# Patient Record
Sex: Female | Born: 1985 | Race: White | Hispanic: No | Marital: Single | Smoking: Never smoker
Health system: Southern US, Community
[De-identification: ages and names within clinical notes are randomized; demographics above are authoritative.]

## PROBLEM LIST (undated history)

## (undated) DIAGNOSIS — J45909 Unspecified asthma, uncomplicated: Secondary | ICD-10-CM

## (undated) DIAGNOSIS — F329 Major depressive disorder, single episode, unspecified: Secondary | ICD-10-CM

## (undated) DIAGNOSIS — D649 Anemia, unspecified: Secondary | ICD-10-CM

## (undated) DIAGNOSIS — F32A Depression, unspecified: Secondary | ICD-10-CM

## (undated) HISTORY — DX: Unspecified asthma, uncomplicated: J45.909

## (undated) HISTORY — DX: Depression, unspecified: F32.A

## (undated) HISTORY — DX: Major depressive disorder, single episode, unspecified: F32.9

## (undated) HISTORY — DX: Anemia, unspecified: D64.9

---
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
## 2007-05-21 IMAGING — US US RENAL KIDNEY
1 series · 17 of 25 positions shown · non-contrast
Comparison: none

REASON FOR EXAM: Pyelonephritis
COMMENTS:

[Series 1: us renal kidney · 17 of 26 slices shown]
[im 1/26]
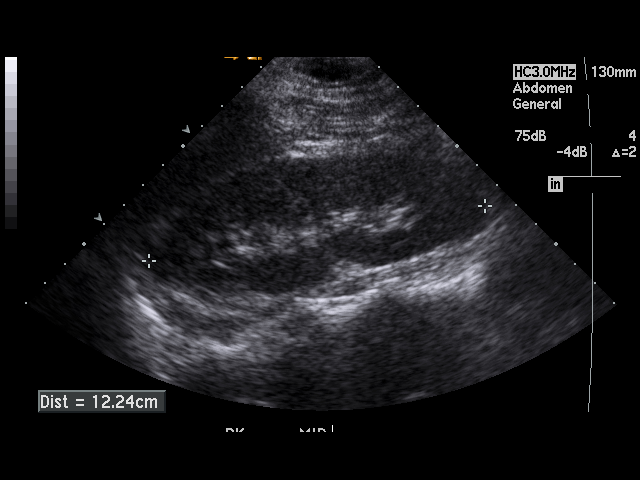
[im 3/26]
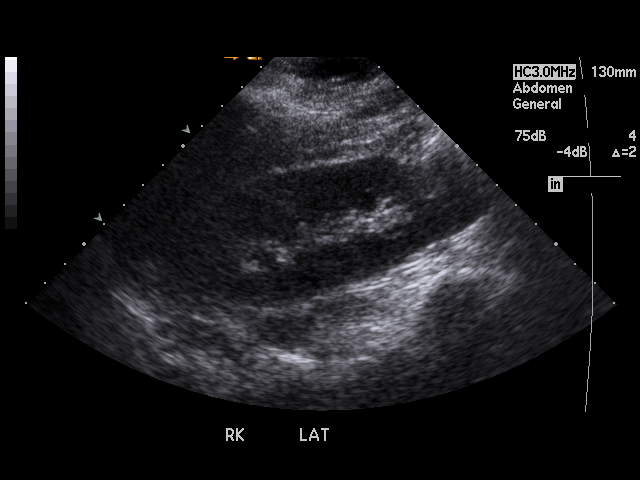
[im 4/26]
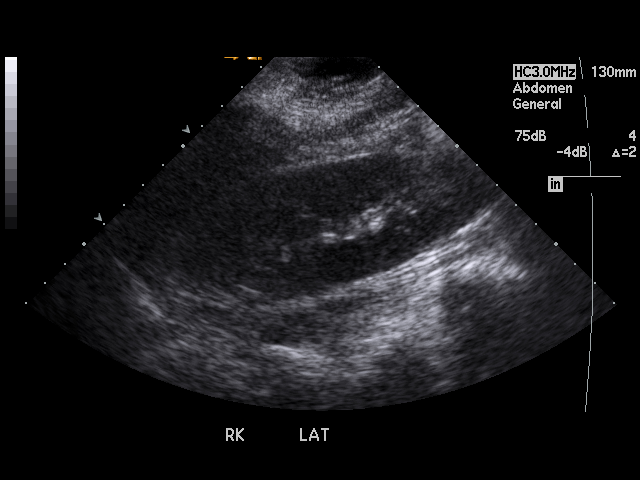
[im 6/26]
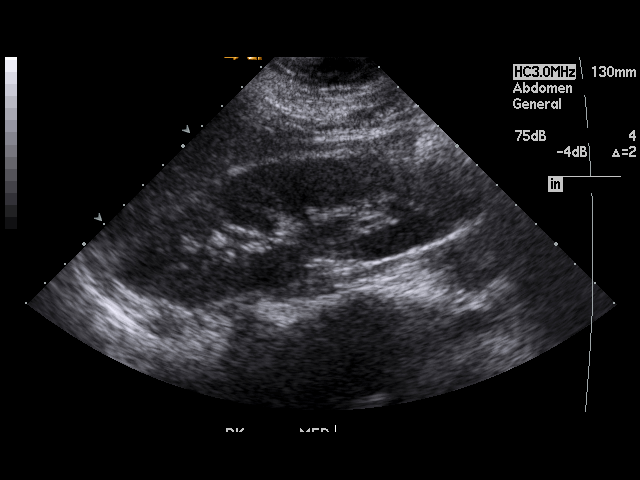
[im 7/26]
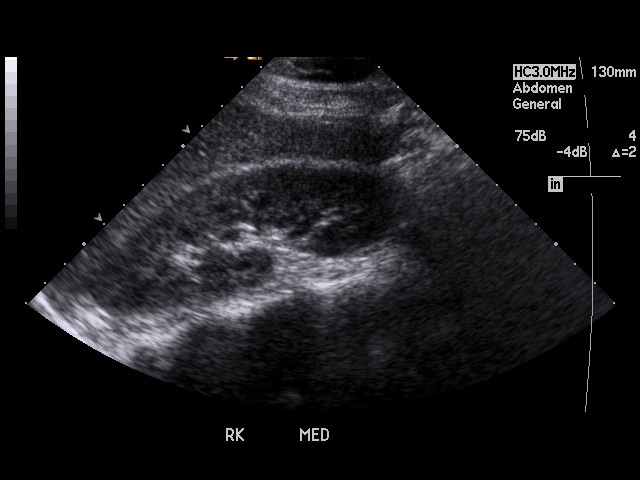
[im 9/26]
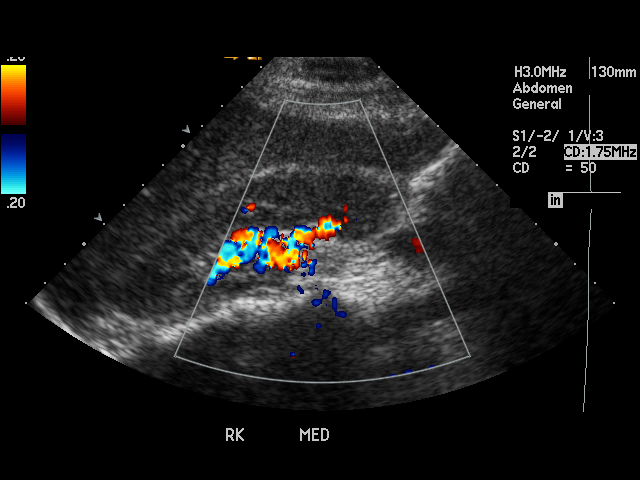
[im 10/26]
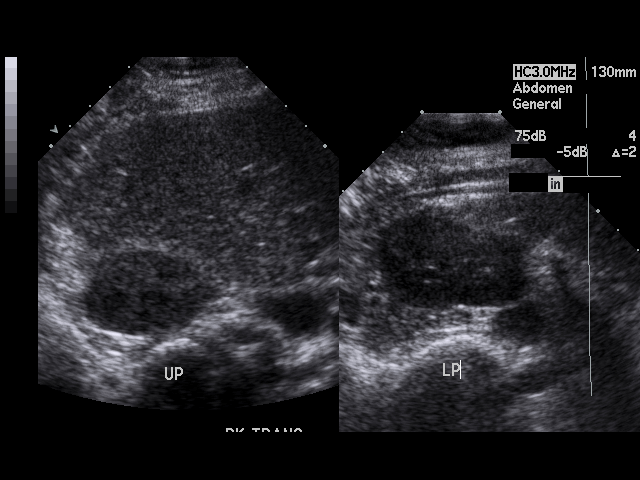
[im 12/26]
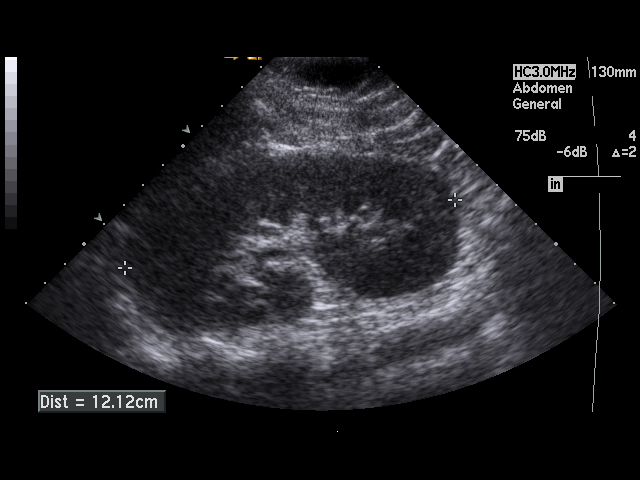
[im 13/26]
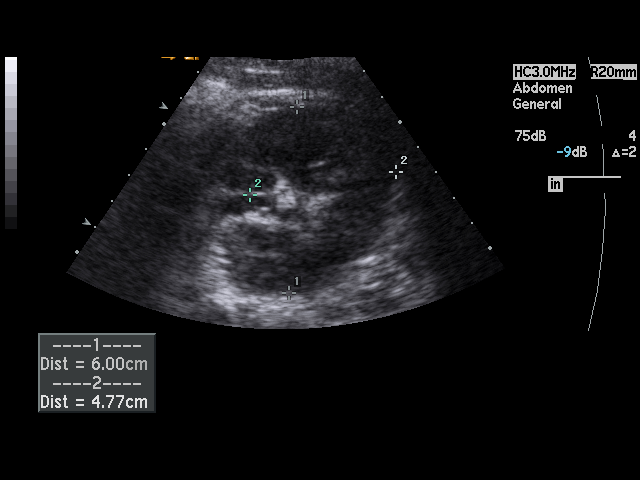
[im 14/26]
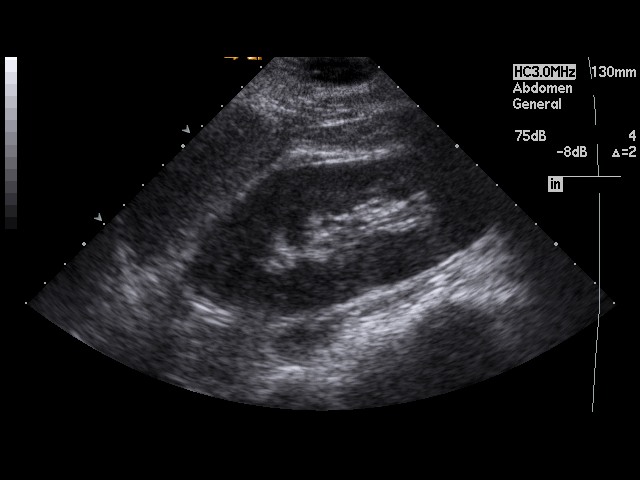
[im 16/26]
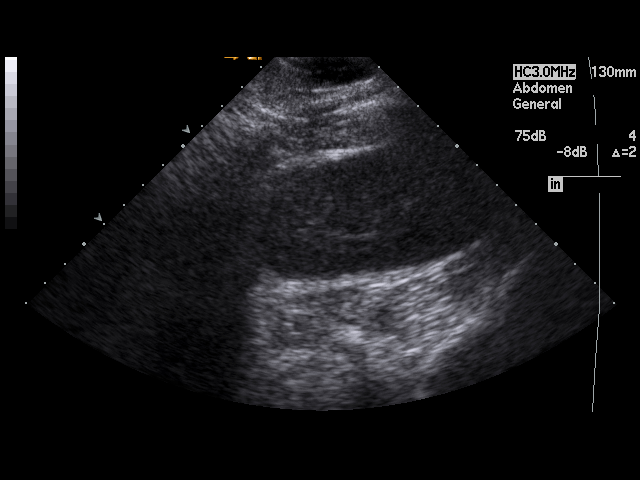
[im 17/26]
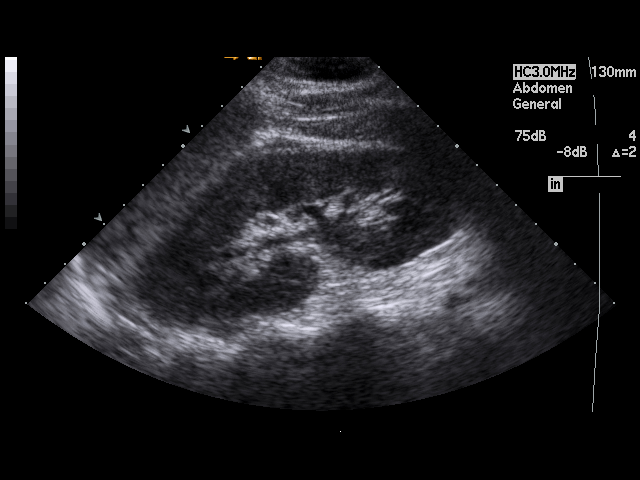
[im 19/26]
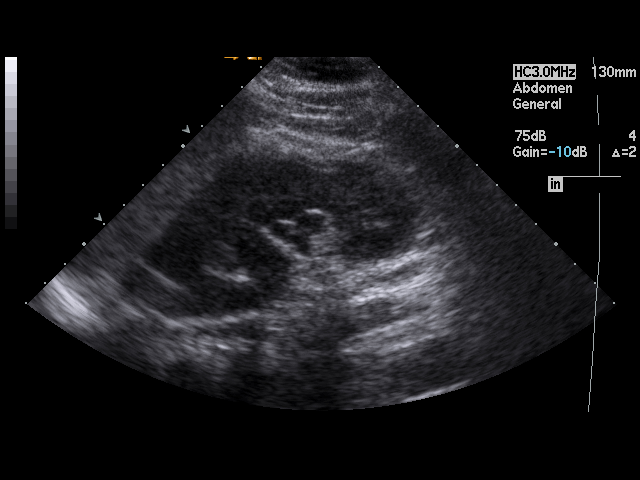
[im 20/26]
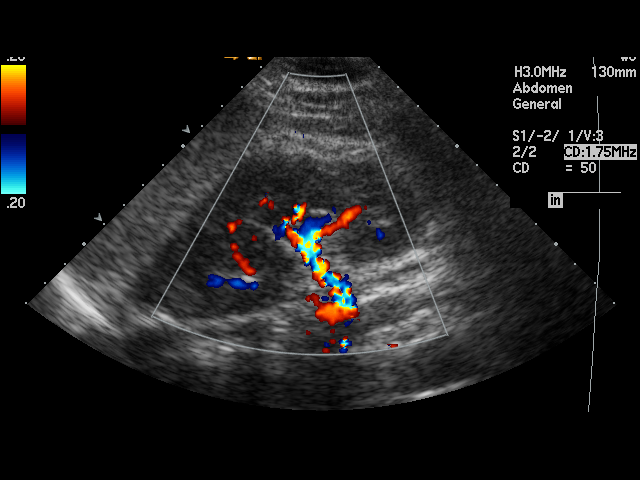
[im 22/26]
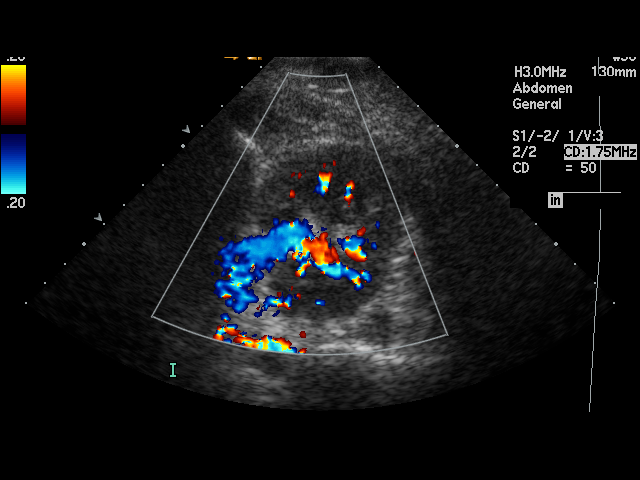
[im 23/26]
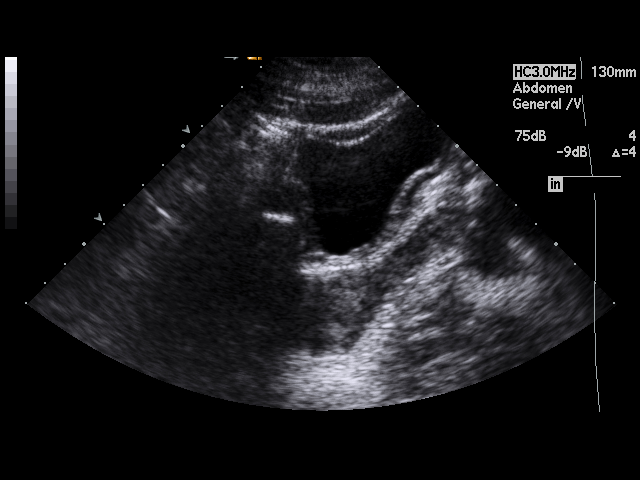
[im 26/26]
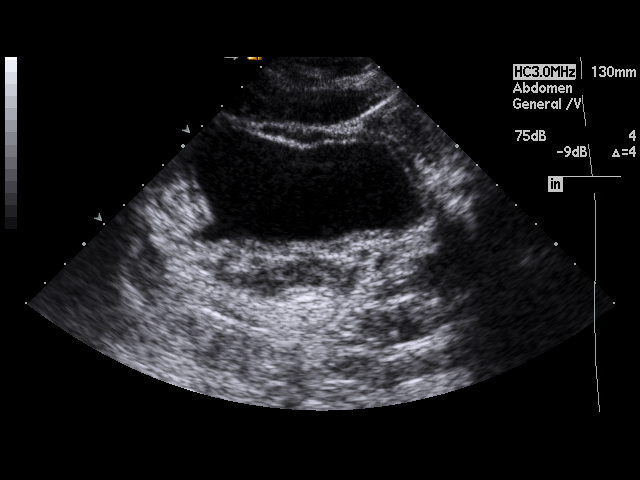

[17 of 25 positions shown; findings below may reference images not displayed]

PROCEDURE:     US  - US KIDNEY BILATERAL  - July 05, 2005  [DATE]

RESULT:     The RIGHT kidney measures 12.24 cm x 4.92 cm x 5.10 cm. The LEFT
kidney measures 12.12 cm x 6.00 cm x 4.77 cm. No solid or cystic renal mass
lesions are seen. Renal cortical margins are smooth. Renal cortex is normal
in thickness bilaterally. There is no hydronephrosis. No renal
calcifications are identified.
IMPRESSION: No significant abnormalities are noted.

## 2009-09-01 ENCOUNTER — Other Ambulatory Visit: Admission: RE | Admit: 2009-09-01 | Discharge: 2009-09-01 | Payer: Self-pay | Admitting: Obstetrics and Gynecology

## 2010-03-08 ENCOUNTER — Inpatient Hospital Stay: Payer: Self-pay | Admitting: Specialist

## 2012-01-24 IMAGING — US US PELV - US TRANSVAGINAL
1 series · 17 of 25 positions shown · non-contrast
Comparison: none

REASON FOR EXAM: Lower abdominal pain. Fever. Hx of IUD
COMMENTS:

[Series 1: us pelv - us transvaginal · 17 of 43 slices shown]
[im 1/43]
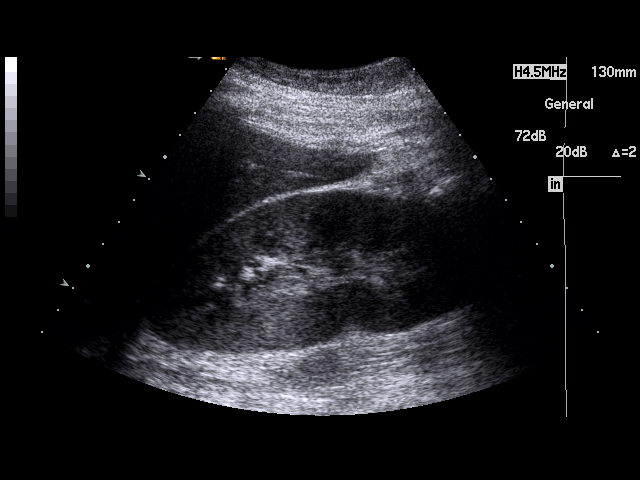
[im 4/43]
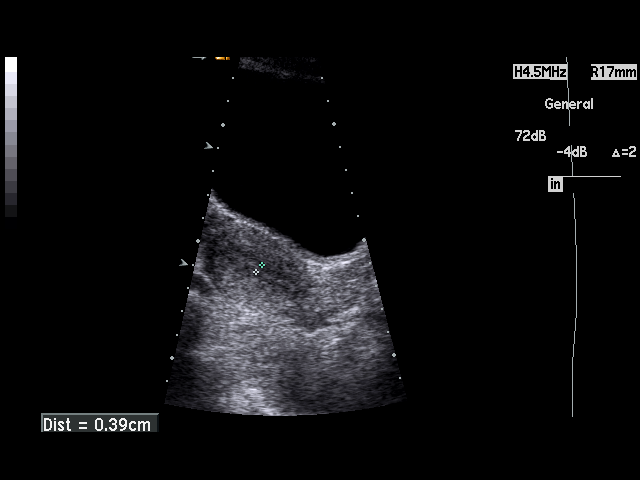
[im 6/43]
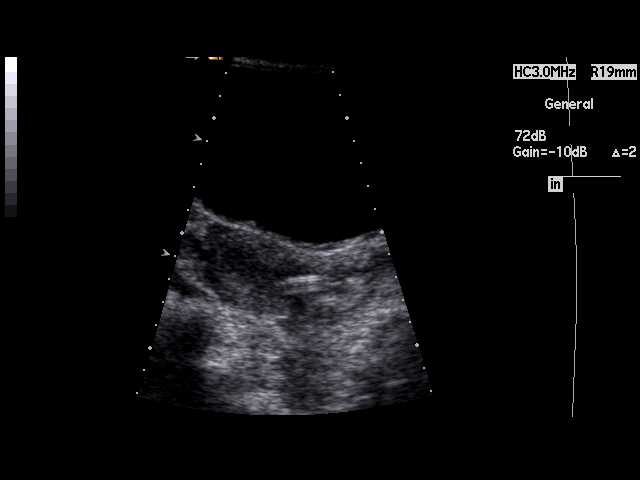
[im 9/43]
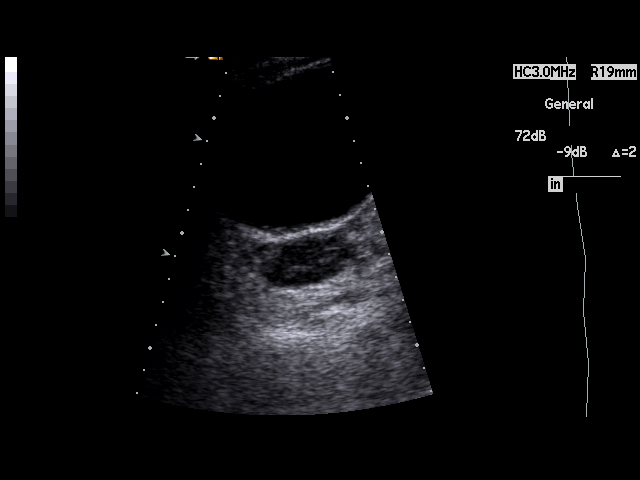
[im 11/43]
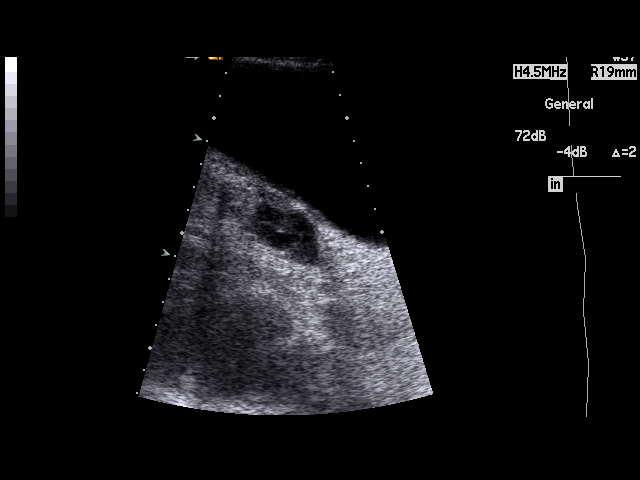
[im 15/43]
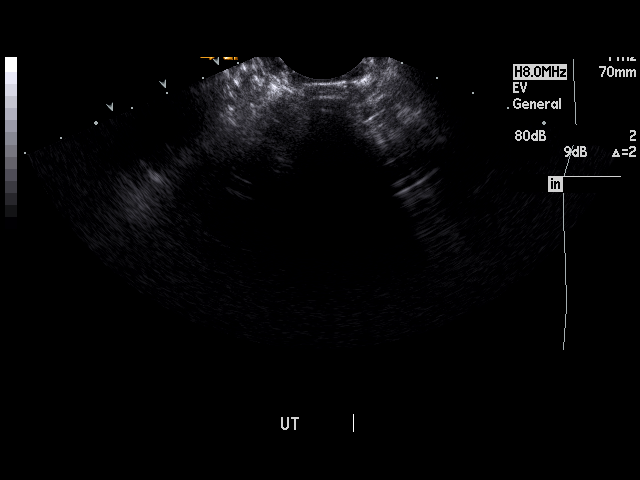
[im 16/43]
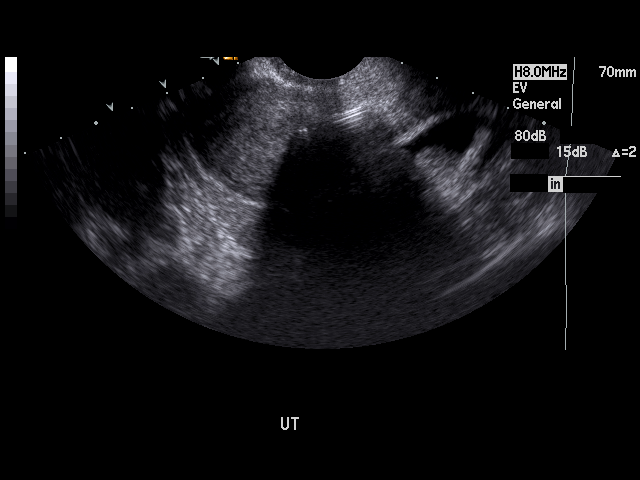
[im 20/43]
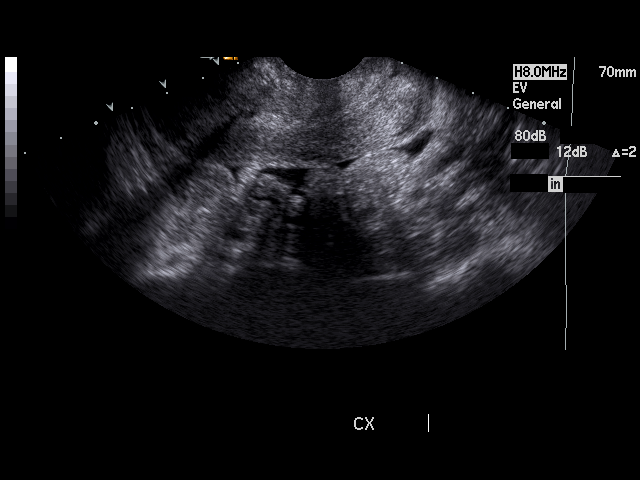
[im 22/43]
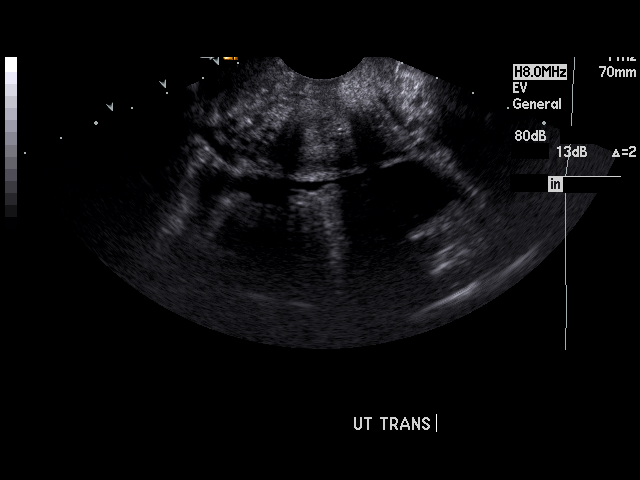
[im 23/43]
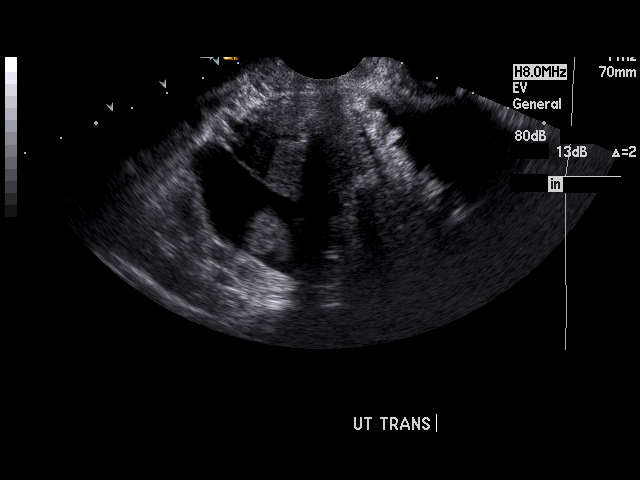
[im 27/43]
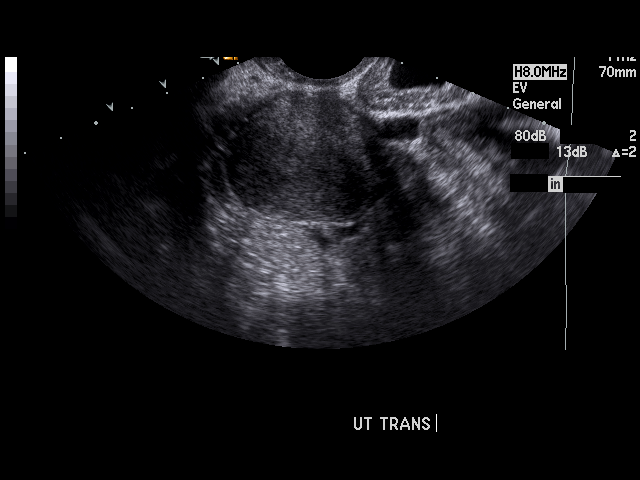
[im 29/43]
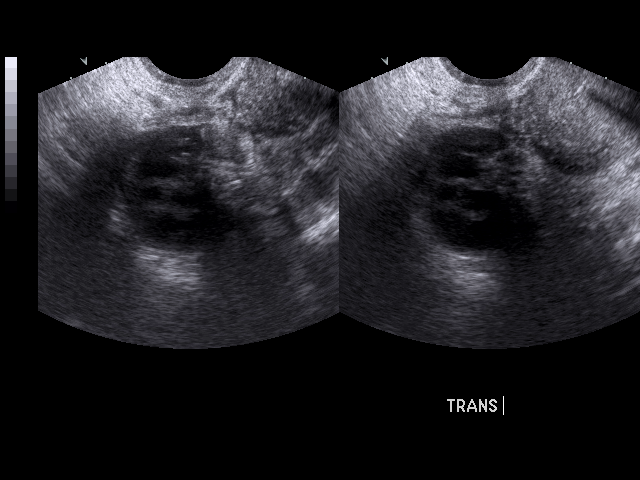
[im 32/43]
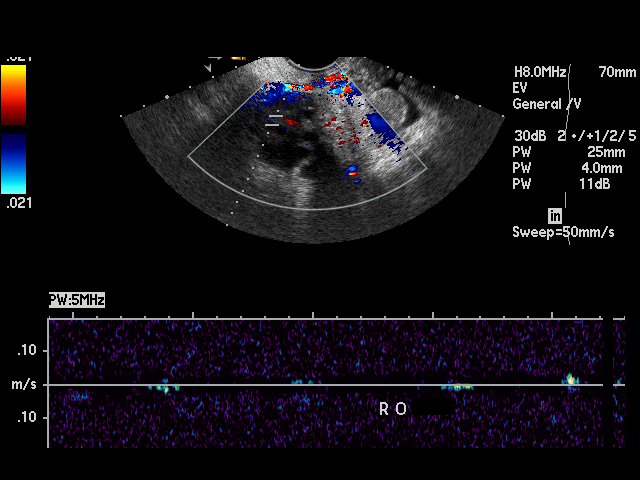
[im 34/43]
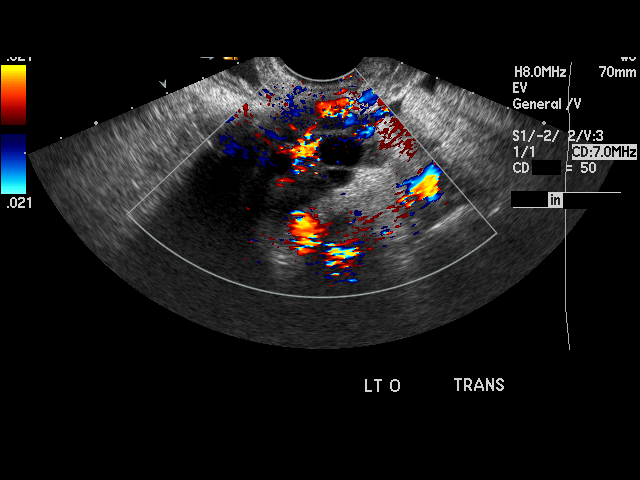
[im 37/43]
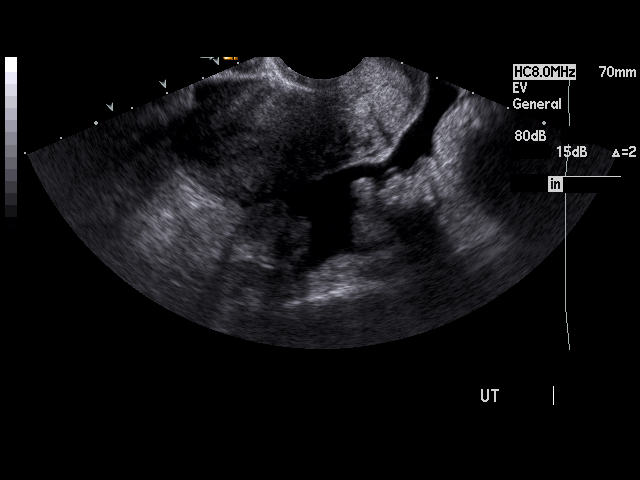
[im 39/43]
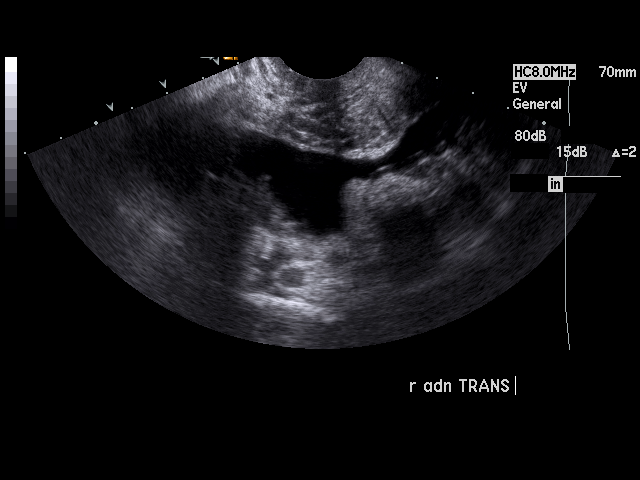
[im 43/43]
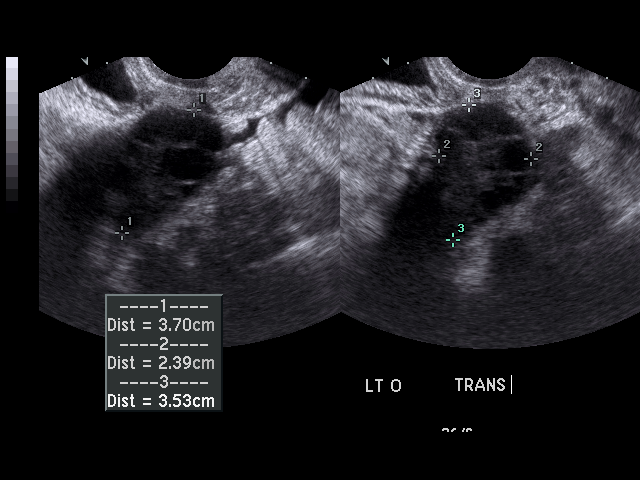

[17 of 25 positions shown; findings below may reference images not displayed]

PROCEDURE:     US  - US PELVIS EXAM W/TRANSVAGINAL  - March 10, 2010  [DATE]

RESULT:     Standard pelvic ultrasound obtained. IUD is noted in the lower
uterine segment. Maximum diameter of the uterus in longitudinal length is
7.1 cm. Free fluid is noted cul-de-sac. Right ovary maximum diameter 3.4 cm,
the left 2.3 cm. Normal ovarian flow present by Doppler. No abscess noted.
No hydronephrosis.
IMPRESSION: IUD noted in the lower uterine segment. Large amount of
free fluid in the cul-de-sac. No abscess noted.

## 2014-08-06 ENCOUNTER — Encounter: Payer: Self-pay | Admitting: Podiatry

## 2014-08-06 ENCOUNTER — Ambulatory Visit (INDEPENDENT_AMBULATORY_CARE_PROVIDER_SITE_OTHER): Payer: Managed Care, Other (non HMO) | Admitting: Podiatry

## 2014-08-06 VITALS — BP 128/84 | HR 83 | Resp 16 | Ht 65.0 in | Wt 175.0 lb

## 2014-08-06 DIAGNOSIS — Z9289 Personal history of other medical treatment: Secondary | ICD-10-CM

## 2014-08-06 DIAGNOSIS — L603 Nail dystrophy: Secondary | ICD-10-CM | POA: Diagnosis not present

## 2014-08-06 DIAGNOSIS — Z79899 Other long term (current) drug therapy: Secondary | ICD-10-CM

## 2014-08-06 MED ORDER — TAVABOROLE 5 % EX SOLN: 1.0000 [drp] | CUTANEOUS | Status: DC

## 2014-08-06 NOTE — Progress Notes (Signed)
Subjective:     Patient ID: Jenny Alvarez, female   DOB: 09/19/1985, 29 y.o.   MRN: [id number]  HPI 29 year old female presents the office today with concerns of toenail fungus in both of her feet. She says is ongoing for several liters and she has tried multiple over-the-counter treatments without any relief of symptoms. She does occasionally toenails to her how digit at this time. Denies any redness or drainage along the area. No other complaints at this time.  Review of Systems  All other systems reviewed and are negative.      Objective:   Physical Exam AAO x3, NAD DP/PT pulses palpable bilaterally, CRT less than 3 seconds Protective sensation intact with Simms Weinstein monofilament, vibratory sensation intact, Achilles tendon reflex intact Nails hypertrophic, dystrophic, brittle, discolored x8. The nails affected of the left nails 1-5 and right 1, 4, 5. There is no tenderness to palpation upon the nails at this time. There is no swelling erythema or drainage all nail sites. There does not appear to be any tinea pedis. No areas of tenderness to bilateral lower extremities. MMT 5/5, ROM WNL.  No open lesions or pre-ulcerative lesions.  No overlying edema, erythema, increase in warmth to bilateral lower extremities.  No pain with calf compression, swelling, warmth, erythema bilaterally.      Assessment:     29 year old female onychodystrophy, likely onychomycosis     Plan:     -Treatment options discussed including all alternatives, risks, and complications -At today's appointment the nails were biopsied and sent to [facility] -it does appear to be onychomycosis. We will await the oral treatment the nail biopsy results. However should and proceed with treatment. Discussed with her topical treatment in conjunction oral treatment the sutures are to do. The prescription for Jodi Geralds was sent to [facility]. Application instructions as well as side effects the medication were discussed  with the patient. Also discussed side effects Lamisil/itraconazole -I went ahead and ordered blood work in preparation for oral treatment of nail fungus. -want to be sent the results of the nail fungus culture will call the patient and start oral treatment. -Follow-up 4 weeks after starting oral treatment or sooner if any problems arise. In the meantime, encouraged to call the office with any questions, concerns, change in symptoms.   Ovid Curd, DPM

## 2014-08-06 NOTE — Patient Instructions (Signed)

## 2014-08-07 LAB — CBC WITH DIFFERENTIAL/PLATELET
Basophils Absolute: 0 10*3/uL (ref 0.0–0.2)
Basos: 0 %
EOS (ABSOLUTE): 0.4 10*3/uL (ref 0.0–0.4)
Eos: 4 %
Hematocrit: 38.5 % (ref 34.0–46.6)
Hemoglobin: 13 g/dL (ref 11.1–15.9)
Immature Grans (Abs): 0 10*3/uL (ref 0.0–0.1)
Immature Granulocytes: 0 %
LYMPHS: 27 %
Lymphocytes Absolute: 2.6 10*3/uL (ref 0.7–3.1)
MCH: 30.7 pg (ref 26.6–33.0)
MCHC: 33.8 g/dL (ref 31.5–35.7)
MCV: 91 fL (ref 79–97)
Monocytes Absolute: 0.6 10*3/uL (ref 0.1–0.9)
Monocytes: 6 %
NEUTROS PCT: 63 %
Neutrophils Absolute: 5.8 10*3/uL (ref 1.4–7.0)
Platelets: 278 10*3/uL (ref 150–379)
RBC: 4.24 x10E6/uL (ref 3.77–5.28)
RDW: 14 % (ref 12.3–15.4)
WBC: 9.4 10*3/uL (ref 3.4–10.8)

## 2014-08-07 LAB — HEPATIC FUNCTION PANEL
ALK PHOS: 57 IU/L (ref 39–117)
ALT: 24 IU/L (ref 0–32)
AST: 45 IU/L — ABNORMAL HIGH (ref 0–40)
Albumin: 4.5 g/dL (ref 3.5–5.5)
BILIRUBIN TOTAL: 0.2 mg/dL (ref 0.0–1.2)
BILIRUBIN, DIRECT: 0.06 mg/dL (ref 0.00–0.40)
TOTAL PROTEIN: 7 g/dL (ref 6.0–8.5)

## 2014-08-08 ENCOUNTER — Telehealth: Payer: Self-pay | Admitting: *Deleted

## 2014-08-08 DIAGNOSIS — Z79899 Other long term (current) drug therapy: Secondary | ICD-10-CM

## 2014-08-08 NOTE — Telephone Encounter (Signed)
Called patient to let her know that Dr Ardelle Anton would like to repeat one of her labs due to increase in liver enzymes. Would like her to do before starting her medication treatment , Patient notified and aware of picking up her forms for [facility]

## 2014-08-09 LAB — HEPATIC FUNCTION PANEL
ALT: 26 IU/L (ref 0–32)
AST: 48 IU/L — AB (ref 0–40)
Albumin: 4.8 g/dL (ref 3.5–5.5)
Alkaline Phosphatase: 59 IU/L (ref 39–117)
BILIRUBIN TOTAL: 0.3 mg/dL (ref 0.0–1.2)
Bilirubin, Direct: 0.07 mg/dL (ref 0.00–0.40)
TOTAL PROTEIN: 7.3 g/dL (ref 6.0–8.5)

## 2014-08-12 ENCOUNTER — Telehealth: Payer: Self-pay | Admitting: *Deleted

## 2014-08-12 MED ORDER — TAVABOROLE 5 % EX SOLN: 1.0000 [drp] | CUTANEOUS | Status: DC

## 2014-08-12 NOTE — Telephone Encounter (Signed)
Sent rx to [facility] to see if that would help with expense of kerydin for [employer]

## 2014-08-12 NOTE — Telephone Encounter (Signed)
Spoke to Jenny Alvarez concerning her labs, dr Nature conservation officer is wanting her to follow up with her primary care doctor, which she does not have at this time. Does not want Jenny Alvarez to take lamisil . Due to the high cost of the kerydin she is not using that either. Will mail her a copy of her labs and will find out what to do next regarding topical treatment.

## 2014-08-12 NOTE — Telephone Encounter (Signed)
We can do Penlac which should be cheaper. Thanks.

## 2014-08-13 NOTE — Telephone Encounter (Signed)
Spoke to Jenny Alvarez told her to let us know if [facility] does not offer her a better price than [facility].  If not we will send in rx for penlac

## 2014-08-29 ENCOUNTER — Ambulatory Visit: Payer: Managed Care, Other (non HMO) | Admitting: Podiatry

## 2014-09-06 ENCOUNTER — Telehealth: Payer: Self-pay | Admitting: *Deleted

## 2014-09-06 NOTE — Telephone Encounter (Addendum)
Attempted to fax completed [employer] Prior Authorization form for [place], fax would not go through on provided fax line x 3.  I mailed required Medication Prior Authorization Form completed with Kerydin dosing for 11 months and Dr. Ardelle Anton statement that pt has elevated liver enzymes and can not take the oral antifungal medications, with copy of +fungal culture, to [facility], [id number], [place], [place] [address].

## 2014-09-17 ENCOUNTER — Ambulatory Visit (INDEPENDENT_AMBULATORY_CARE_PROVIDER_SITE_OTHER): Payer: Managed Care, Other (non HMO) | Admitting: Podiatry

## 2014-09-17 ENCOUNTER — Encounter: Payer: Self-pay | Admitting: Podiatry

## 2014-09-17 VITALS — BP 126/86 | HR 76 | Resp 18

## 2014-09-17 DIAGNOSIS — B351 Tinea unguium: Secondary | ICD-10-CM | POA: Diagnosis not present

## 2014-09-17 DIAGNOSIS — Z79899 Other long term (current) drug therapy: Secondary | ICD-10-CM | POA: Diagnosis not present

## 2014-09-17 MED ORDER — TERBINAFINE HCL 250 MG PO TABS: 250.0000 mg | ORAL_TABLET | Freq: Every day | ORAL | Status: DC

## 2014-09-17 NOTE — Patient Instructions (Signed)

## 2014-09-18 NOTE — Progress Notes (Signed)
Patient ID: Maisee Vollman, female   DOB: January 20, 1985, 29 y.o.   MRN: [id number]  Subjective: 29 year old female presents the office if discussed nail biopsy results. She states that she has started the carried in a couple weeks ago her nails a been about the same. She denies any pain to the toenails and denies any surrounding redness or drainage. No other complaints at this time in no acute changes.   Objective : AAO 3, NAD  Neurovascular status intact and unchanged  Nails continue to be hypertrophic, dystrophic, brittle, discolored 8. Nails that are affected continue to the left 1 through 5 on the right 1, 4, 5. There is no surrounding erythema or drainage. There is no pain to the nails at this time. No open lesions or pre-ulcerative with calf compression, swelling, warmth, erythema.   Assessment : 29 year old female with onychomycosis   Plan: -Nail biopsy results were discussed the patient. -Continue keratin. -She previously had lab work which showed a very mild increase in liver enzymes. There is repeated and was about the same. She would like to start Lamisil. She states that she had talked to her primary care physician about this. I discussed with her that Lamisil can increased liver enzymes and a fair increase at baseline thickened further elevate. She understands the risks and elected to proceed with Lamisil. 30 days was prescribed to her. I also would have and gave her bloodwork for CBC and LFTs for 30 days. She is to get this before coming to see me next appointment. I discussed side effects the medication directed to stop immediately should any current ongoing office. In the meantime call the office with any questions, concerns, change in symptoms. Follow-up in 4 weeks.  Ovid Curd, DPM

## 2014-10-15 ENCOUNTER — Ambulatory Visit: Payer: Managed Care, Other (non HMO) | Admitting: Podiatry

## 2014-10-21 ENCOUNTER — Other Ambulatory Visit: Payer: Self-pay | Admitting: Podiatry

## 2014-10-22 ENCOUNTER — Ambulatory Visit (INDEPENDENT_AMBULATORY_CARE_PROVIDER_SITE_OTHER): Payer: Managed Care, Other (non HMO) | Admitting: Podiatry

## 2014-10-22 ENCOUNTER — Encounter: Payer: Self-pay | Admitting: Podiatry

## 2014-10-22 VITALS — BP 119/79 | HR 70 | Resp 18

## 2014-10-22 DIAGNOSIS — B351 Tinea unguium: Secondary | ICD-10-CM

## 2014-10-22 DIAGNOSIS — Z79899 Other long term (current) drug therapy: Secondary | ICD-10-CM | POA: Diagnosis not present

## 2014-10-22 LAB — CBC WITH DIFFERENTIAL/PLATELET
Basophils Absolute: 0.1 10*3/uL (ref 0.0–0.2)
Basos: 1 %
EOS (ABSOLUTE): 0.2 10*3/uL (ref 0.0–0.4)
EOS: 3 %
HEMATOCRIT: 38.3 % (ref 34.0–46.6)
HEMOGLOBIN: 12.9 g/dL (ref 11.1–15.9)
Immature Grans (Abs): 0 10*3/uL (ref 0.0–0.1)
Immature Granulocytes: 0 %
LYMPHS ABS: 2.3 10*3/uL (ref 0.7–3.1)
Lymphs: 26 %
MCH: 31.1 pg (ref 26.6–33.0)
MCHC: 33.7 g/dL (ref 31.5–35.7)
MCV: 92 fL (ref 79–97)
MONOCYTES: 6 %
MONOS ABS: 0.6 10*3/uL (ref 0.1–0.9)
NEUTROS ABS: 5.8 10*3/uL (ref 1.4–7.0)
Neutrophils: 64 %
Platelets: 307 10*3/uL (ref 150–379)
RBC: 4.15 x10E6/uL (ref 3.77–5.28)
RDW: 13.4 % (ref 12.3–15.4)
WBC: 9 10*3/uL (ref 3.4–10.8)

## 2014-10-22 LAB — HEPATIC FUNCTION PANEL
ALBUMIN: 4.6 g/dL (ref 3.5–5.5)
ALK PHOS: 62 IU/L (ref 39–117)
ALT: 19 IU/L (ref 0–32)
AST: 24 IU/L (ref 0–40)
BILIRUBIN TOTAL: 0.2 mg/dL (ref 0.0–1.2)
Bilirubin, Direct: 0.09 mg/dL (ref 0.00–0.40)
Total Protein: 7.3 g/dL (ref 6.0–8.5)

## 2014-10-22 MED ORDER — TERBINAFINE HCL 250 MG PO TABS: 250.0000 mg | ORAL_TABLET | Freq: Every day | ORAL | Status: DC

## 2014-10-22 NOTE — Patient Instructions (Signed)

## 2014-10-22 NOTE — Progress Notes (Signed)
Patient ID: Jenny Alvarez, female   DOB: March 17, 1985, 29 y.o.   MRN: [id number]  Subjective: 29 year old female presents the office today for follow-up evaluation after starting Lamisil 30 days ago. She denies any side effects the medication. She has not seen much improvement in her nails open last 30 days. Denies any redness or drainage from the nail sites. No pain to the toenails. No other complaints at this time in no acute changes since last appointment.  Objective: AAO 3, NAD DP/PT pulses 2/4, CRT less than 3 seconds Nails continue to be hypertrophic, dystrophic, brittle, discolored 8. The nails 1-5 on the left and 1, 4, 5 on the right. There is no tenderness to palpation of nails is no surrounding erythema or drainage. There is toenail polish on some of the nails which makes it difficult to evaluate the nails completely. No open lesions or pre-ulcer lesions identified bilaterally. No pain with calf compression, swelling, warmth, erythema.  Assessment: 29 year old female 30 days starting Lamisil without any side effects  Plan: -Discussed side effects the medication she wishes to pursue her course of treatment. 60 days of Lamisil was prescribed to the patient. Repeat LFT and CBC was also prescribed. Encouraged her not to start medications I called with results of the blood work. *If with her enzymes remained somewhat elevated we'll repeat blood work and 30 days. If the liver enzymes are drastically increased, will stop lamisil. -Follow-up in 8 weeks or sooner if any problems arise. In the meantime, encouraged to call the office with any questions, concerns, change in symptoms.   Ovid CurdMatthew Jerrilynn Mikowski, DPM

## 2014-10-24 ENCOUNTER — Other Ambulatory Visit: Payer: Self-pay

## 2014-10-24 ENCOUNTER — Telehealth: Payer: Self-pay | Admitting: Podiatry

## 2014-10-24 ENCOUNTER — Telehealth: Payer: Self-pay

## 2014-10-24 MED ORDER — TERBINAFINE HCL 250 MG PO TABS: 250.0000 mg | ORAL_TABLET | Freq: Every day | ORAL | Status: DC

## 2014-10-24 NOTE — Telephone Encounter (Signed)
Spoke with pt regarding lab results, advised her to begin taking lamisil and re-do blood work in one month, f/u in 2 months.

## 2014-10-24 NOTE — Telephone Encounter (Signed)
Pt called and stated she went to pharmacy and they did not have her rx. I seen a rx for lamisil but it looks like it was not sent electronically. Pt would like it to got to [facility] in [place] please. If you would please let pt know when done.

## 2014-10-25 ENCOUNTER — Telehealth: Payer: Self-pay | Admitting: *Deleted

## 2014-10-25 NOTE — Telephone Encounter (Signed)
Left message for patient to call me back about her prescription. Nunzio Banet

## 2014-10-28 MED ORDER — TERBINAFINE HCL 250 MG PO TABS: 250.0000 mg | ORAL_TABLET | Freq: Every day | ORAL | Status: DC

## 2014-10-28 NOTE — Telephone Encounter (Signed)
error 

## 2014-10-28 NOTE — Addendum Note (Signed)
Addended by: Hadley PenOX, Unknown Schleyer R on: 10/28/2014 09:50 AM   Modules accepted: Orders

## 2014-10-28 NOTE — Telephone Encounter (Signed)
I REORDERED THE LAMISIL 250 MG, 30 TABLET AND NO REFILLS TO [facility] [place]  PER DR Ardelle AntonWAGONER. LEFT A MESSAGE TO GO AND PICK IT UP. Jenny Alvarez

## 2014-12-17 ENCOUNTER — Ambulatory Visit: Payer: Managed Care, Other (non HMO) | Admitting: Podiatry

## 2014-12-19 ENCOUNTER — Ambulatory Visit: Payer: Managed Care, Other (non HMO) | Admitting: Podiatry

## 2016-11-03 ENCOUNTER — Encounter: Payer: Self-pay | Admitting: Family Medicine

## 2016-11-05 ENCOUNTER — Encounter: Payer: Self-pay | Admitting: Podiatry

## 2016-11-05 ENCOUNTER — Encounter: Payer: Self-pay | Admitting: Family Medicine

## 2016-11-05 ENCOUNTER — Ambulatory Visit (INDEPENDENT_AMBULATORY_CARE_PROVIDER_SITE_OTHER): Payer: Commercial Managed Care - PPO | Admitting: Family Medicine

## 2016-11-05 ENCOUNTER — Ambulatory Visit: Payer: Self-pay | Admitting: Emergency Medicine

## 2016-11-05 VITALS — BP 104/62 | HR 96 | Temp 98.9°F | Resp 18 | Ht 65.35 in | Wt 181.8 lb

## 2016-11-05 DIAGNOSIS — Z0000 Encounter for general adult medical examination without abnormal findings: Secondary | ICD-10-CM

## 2016-11-05 DIAGNOSIS — F331 Major depressive disorder, recurrent, moderate: Secondary | ICD-10-CM | POA: Diagnosis not present

## 2016-11-05 MED ORDER — FLUOXETINE HCL 20 MG PO TABS: 20.0000 mg | ORAL_TABLET | Freq: Every day | ORAL | 3 refills | Status: DC

## 2016-11-05 NOTE — Patient Instructions (Signed)
     IF you received an x-ray today, you will receive an invoice from [facility]. Please contact [facility] at [phone] with questions or concerns regarding your invoice.   IF you received labwork today, you will receive an invoice from [facility]. Please contact [facility] at [phone] with questions or concerns regarding your invoice.   Our billing staff will not be able to assist you with questions regarding bills from these companies.  You will be contacted with the lab results as soon as they are available. The fastest way to get your results is to activate your My Chart account. Instructions are located on the last page of this paperwork. If you have not heard from us regarding the results in 2 weeks, please contact this office.     

## 2016-11-05 NOTE — Progress Notes (Signed)
11/2/20189:34 AM  Clelia CroftMia Alvarez 04/05/1985, 31 y.o. female [id number]  Chief Complaint  Patient presents with  . Establish Care  . Depression    screening was a 4219     HPI:   Patient is a 31 y.o. female with past medical history significant for depression, endometriosis, acne who presents today for annual exam.  G0 mirena Pap 2017, negative per patient Endometriosis  Has had issues with depression in the past, did well on fluoxetine, has done counseling, past SI, no attempts, no current SI or HI In relationship, men, safe Would like to restart medication  No t/e/d Follows healthy diet Exercises 5 days a week, aerobic and strength, personal trainer. Has lost about 15 lbs in past 6 months Works as Transport plannersales manager for [employer]  Depression screen PHQ 2/9 11/05/2016  Decreased Interest 1  Down, Depressed, Hopeless 1  PHQ - 2 Score 2  Altered sleeping 3  Tired, decreased energy 3  Change in appetite 3  Feeling bad or failure about yourself  2  Trouble concentrating 3  Moving slowly or fidgety/restless 3  Suicidal thoughts 0  PHQ-9 Score 19    No Known Allergies  Prior to Admission medications   Medication Sig Start Date End Date Taking? Authorizing Provider  doxycycline (ADOXA) 150 MG tablet Take 150 mg by mouth daily.   Yes [provider]  levonorgestrel (MIRENA) 20 MCG/24HR IUD 1 each by Intrauterine route once.   Yes [provider]  spironolactone (ALDACTONE) 50 MG tablet Take 50 mg by mouth daily.   Yes [provider]  tretinoin microspheres (RETIN-A MICRO) 0.04 % gel Apply topically at bedtime.   Yes [provider]  FLUoxetine (PROZAC) 20 MG tablet Take 1 tablet (20 mg total) by mouth daily. 11/05/16   Myles LippsSantiago, Tailynn Armetta M, MD    Past Medical History:  Diagnosis Date  . Anemia   . Asthma   . Depression     History reviewed. No pertinent surgical history.  Social History  Substance Use Topics  . Smoking status: Never  Smoker  . Smokeless tobacco: Never Used  . Alcohol use No    History reviewed. No pertinent family history.  Review of Systems  Constitutional: Negative for chills and fever.  HENT: Negative for congestion, ear pain and sore throat.   Respiratory: Negative for cough and shortness of breath.   Cardiovascular: Negative for chest pain, palpitations and leg swelling.  Gastrointestinal: Negative for abdominal pain, nausea and vomiting.  Genitourinary: Negative for dysuria and hematuria.  Musculoskeletal: Positive for myalgias.  Neurological: Negative for dizziness and headaches.  Psychiatric/Behavioral: Positive for depression. Negative for suicidal ideas. The patient has insomnia. The patient is not nervous/anxious.      OBJECTIVE:  Blood pressure 104/62, pulse 96, temperature 98.9 F (37.2 C), temperature source Oral, resp. rate 18, height 5' 5.35" (1.66 m), weight 181 lb 12.8 oz (82.5 kg), SpO2 97 %.  Physical Exam  Constitutional: She is oriented to person, place, and time and well-developed, well-nourished, and in no distress.  HENT:  Head: Normocephalic and atraumatic.  Right Ear: Hearing, tympanic membrane, external ear and ear canal normal.  Left Ear: Hearing, tympanic membrane, external ear and ear canal normal.  Mouth/Throat: Oropharynx is clear and moist.  Eyes: Pupils are equal, round, and reactive to light. EOM are normal.  Neck: Neck supple. No thyromegaly present.  Cardiovascular: Normal rate, regular rhythm, normal heart sounds and intact distal pulses.  Exam reveals no gallop and no  friction rub.   No murmur heard. Pulmonary/Chest: Effort normal and breath sounds normal. She has no wheezes. She has no rales.  Abdominal: Soft. Bowel sounds are normal. She exhibits no distension and no mass. There is no tenderness.  Musculoskeletal: Normal range of motion. She exhibits no edema.  Lymphadenopathy:    She has no cervical adenopathy.  Neurological: She is alert and  oriented to person, place, and time. She has normal reflexes. Gait normal.  Skin: Skin is warm and dry.  Psychiatric: Mood and affect normal.  Nursing note and vitals reviewed.   ASSESSMENT and PLAN  1. Annual physical exam HCM reviewed/discussed. Anticipatory guidance regarding healthy weight, lifestyle and choices given.   - TSH - Basic metabolic panel   2. Moderate episode of recurrent major depressive disorder (HCC) Restarting fluoxetine today, r/se/b discussed. - FLUoxetine (PROZAC) 20 MG tablet; Take 1 tablet (20 mg total) by mouth daily. - TSH   Return in about 4 weeks (around 12/03/2016).    Myles Lipps, MD Primary Care at [facility] [address] [place], [place] [address] Ph.  [phone] Fax [phone]

## 2016-11-06 LAB — TSH: TSH: 1.5 u[IU]/mL (ref 0.450–4.500)

## 2016-11-06 LAB — BASIC METABOLIC PANEL
BUN/Creatinine Ratio: 12 (ref 9–23)
BUN: 8 mg/dL (ref 6–20)
CO2: 22 mmol/L (ref 20–29)
Calcium: 10 mg/dL (ref 8.7–10.2)
Chloride: 105 mmol/L (ref 96–106)
Creatinine, Ser: 0.68 mg/dL (ref 0.57–1.00)
GFR calc Af Amer: 137 mL/min/{1.73_m2} (ref >59)
GFR calc non Af Amer: 119 mL/min/{1.73_m2} (ref >59)
Glucose: 91 mg/dL (ref 65–99)
Potassium: 4.5 mmol/L (ref 3.5–5.2)
Sodium: 141 mmol/L (ref 134–144)

## 2017-01-13 ENCOUNTER — Other Ambulatory Visit: Payer: Self-pay

## 2017-01-13 ENCOUNTER — Ambulatory Visit: Payer: Commercial Managed Care - PPO | Admitting: Family Medicine

## 2017-01-13 ENCOUNTER — Encounter: Payer: Self-pay | Admitting: Family Medicine

## 2017-01-13 VITALS — BP 108/64 | HR 74 | Temp 98.6°F | Ht 64.57 in | Wt 187.4 lb

## 2017-01-13 DIAGNOSIS — F3341 Major depressive disorder, recurrent, in partial remission: Secondary | ICD-10-CM | POA: Insufficient documentation

## 2017-01-13 DIAGNOSIS — F330 Major depressive disorder, recurrent, mild: Secondary | ICD-10-CM | POA: Insufficient documentation

## 2017-01-13 DIAGNOSIS — F331 Major depressive disorder, recurrent, moderate: Secondary | ICD-10-CM | POA: Diagnosis not present

## 2017-01-13 MED ORDER — FLUOXETINE HCL 40 MG PO CAPS: 40.0000 mg | ORAL_CAPSULE | Freq: Every day | ORAL | 1 refills | Status: DC

## 2017-01-13 NOTE — Progress Notes (Signed)
   1/10/20198:15 AM  Jenny Alvarez 07/30/1985, 32 y.o. female [id number]  Chief Complaint  Patient presents with  . Follow-up    HPI:   Patient is a 32 y.o. female with past medical history significant for depression who presents today for follow-up.   Has been back on fluoxetine 20mg  once a day for about 2 months without any significant changes in symptoms/mood. Still feels very tired, wants to sleep all day, decreased interest. Sleep has become disturbed with 2-3 awakenings, some of them startled. Sometimes wakes up with headaches. She does snore.   [facility] 2/9 01/13/2017 11/05/2016  Decreased Interest 2 1  Down, Depressed, Hopeless 1 1  PHQ - 2 Score 3 2  Altered sleeping 3 3  Tired, decreased energy 3 3  Change in appetite 2 3  Feeling bad or failure about yourself  1 2  Trouble concentrating 3 3  Moving slowly or fidgety/restless 0 3  Suicidal thoughts 0 0  PHQ-9 Score 15 19  Difficult doing work/chores Somewhat difficult -    No Known Allergies  Prior to Admission medications   Medication Sig Start Date End Date Taking? Authorizing Provider  doxycycline (ADOXA) 150 MG tablet Take 150 mg by mouth daily.   Yes [provider]  FLUoxetine (PROZAC) 20 MG tablet Take 1 tablet (20 mg total) by mouth daily. 11/05/16  Yes Myles LippsSantiago, Damia Bobrowski M, MD  levonorgestrel (MIRENA) 20 MCG/24HR IUD 1 each by Intrauterine route once.   Yes [provider]  levonorgestrel (MIRENA) 20 MCG/24HR IUD 1 each by Intrauterine route once.   Yes [provider]  spironolactone (ALDACTONE) 50 MG tablet Take 50 mg by mouth daily.   Yes [provider]  Tavaborole (KERYDIN) 5 % SOLN Apply 1 drop topically 1 day or 1 dose. Apply 1 drop to the toenail daily. 08/12/14  Yes Vivi BarrackWagoner, Matthew R, DPM    Past Medical History:  Diagnosis Date  . Anemia   . Asthma   . Depression     History reviewed. No pertinent surgical history.  Social History   Tobacco  Use  . Smoking status: Never Smoker  . Smokeless tobacco: Never Used  Substance Use Topics  . Alcohol use: No    History reviewed. No pertinent family history.  ROS Per hpi  OBJECTIVE:  Blood pressure 108/64, pulse 74, temperature 98.6 F (37 C), temperature source Oral, height 5' 4.57" (1.64 m), weight 187 lb 6.4 oz (85 kg), SpO2 98 %.  Physical Exam  Constitutional: She is oriented to person, place, and time and well-developed, well-nourished, and in no distress.  HENT:  Head: Normocephalic and atraumatic.  Mouth/Throat: Mucous membranes are normal.  Eyes: EOM are normal. Pupils are equal, round, and reactive to light. No scleral icterus.  Neck: Neck supple.  Pulmonary/Chest: Effort normal.  Neurological: She is alert and oriented to person, place, and time. Gait normal.  Skin: Skin is warm and dry.  Psychiatric: Mood and affect normal.  Nursing note and vitals reviewed.    ASSESSMENT and PLAN  1. Moderate episode of recurrent major depressive disorder (HCC) Increasing fluoxetine, however discussed possibility of maybe more sleep disorder.  - FLUoxetine (PROZAC) 40 MG capsule; Take 1 capsule (40 mg total) by mouth daily.  Return in about 4 weeks (around 02/10/2017).    Myles LippsIrma M Santiago, MD [facility] at [facility] [address] [place], [place] [address] Ph.  [phone] Fax [phone]

## 2017-01-13 NOTE — Patient Instructions (Signed)
     IF you received an x-ray today, you will receive an invoice from [facility]. Please contact [facility] at [phone] with questions or concerns regarding your invoice.   IF you received labwork today, you will receive an invoice from [facility]. Please contact [facility] at [phone] with questions or concerns regarding your invoice.   Our billing staff will not be able to assist you with questions regarding bills from these companies.  You will be contacted with the lab results as soon as they are available. The fastest way to get your results is to activate your My Chart account. Instructions are located on the last page of this paperwork. If you have not heard from us regarding the results in 2 weeks, please contact this office.     

## 2017-01-19 DIAGNOSIS — L700 Acne vulgaris: Secondary | ICD-10-CM | POA: Diagnosis not present

## 2017-01-19 DIAGNOSIS — Z79899 Other long term (current) drug therapy: Secondary | ICD-10-CM | POA: Diagnosis not present

## 2017-05-16 DIAGNOSIS — Z79899 Other long term (current) drug therapy: Secondary | ICD-10-CM | POA: Diagnosis not present

## 2017-05-16 DIAGNOSIS — L700 Acne vulgaris: Secondary | ICD-10-CM | POA: Diagnosis not present

## 2017-06-16 DIAGNOSIS — Z79899 Other long term (current) drug therapy: Secondary | ICD-10-CM | POA: Diagnosis not present

## 2017-06-16 DIAGNOSIS — L700 Acne vulgaris: Secondary | ICD-10-CM | POA: Diagnosis not present

## 2017-06-28 ENCOUNTER — Other Ambulatory Visit: Payer: Self-pay

## 2017-06-28 ENCOUNTER — Encounter: Payer: Self-pay | Admitting: Family Medicine

## 2017-06-28 ENCOUNTER — Ambulatory Visit: Payer: Commercial Managed Care - PPO | Admitting: Family Medicine

## 2017-06-28 VITALS — BP 112/86 | HR 65 | Temp 98.8°F | Ht 65.0 in | Wt 196.6 lb

## 2017-06-28 DIAGNOSIS — F331 Major depressive disorder, recurrent, moderate: Secondary | ICD-10-CM | POA: Diagnosis not present

## 2017-06-28 MED ORDER — CITALOPRAM HYDROBROMIDE 20 MG PO TABS: 20.0000 mg | ORAL_TABLET | Freq: Every day | ORAL | 3 refills | Status: DC

## 2017-06-28 MED ORDER — HYDROXYZINE HCL 25 MG PO TABS: 25.0000 mg | ORAL_TABLET | Freq: Every evening | ORAL | 2 refills | Status: DC | PRN

## 2017-06-28 NOTE — Progress Notes (Signed)
6/25/201911:33 AM  Jenny Alvarez Apr 29, 1985, 32 y.o. female [id number]  Chief Complaint  Patient presents with  . Depression    does not feel like the medication is working, no sex drive, short tempered, not sleeping at all    HPI:   Patient is a [age] y.o. female with past medical history significant for depression who presents today for followup  Patient not doing well on fluoxetine Continues to struggle with anhedonia and insomnia. She has also of recent been having recurring dreams of past traumatic events from her childhood. She has done therapy for this before. She denies any hypervigilance or intrusive thoughts.  She is also having decreased sexual drive since increased dose of fluoxetine She has not tried any other meds in the past She denies any SI PHQ9 and GAD 7 noted  Patient Care Team: Patient, No Pcp Per as PCP - General (General Practice) Patient, No Pcp Per (General Practice) Deirdre EvenerKowalski, David C, MD (Dermatology)    Fall Risk  06/28/2017 01/13/2017 11/05/2016  Falls in the past year? No No No     [facility] screen [facility] 2/9 06/28/2017 06/28/2017 01/13/2017  Decreased Interest 3 0 2  Down, Depressed, Hopeless 2 0 1  PHQ - 2 Score 5 0 3  Altered sleeping 3 - 3  Tired, decreased energy 3 - 3  Change in appetite 3 - 2  Feeling bad or failure about yourself  1 - 1  Trouble concentrating 2 - 3  Moving slowly or fidgety/restless 1 - 0  Suicidal thoughts 0 - 0  PHQ-9 Score 18 - 15  Difficult doing work/chores - - Somewhat difficult   GAD 7 : Generalized Anxiety Score 06/28/2017  Nervous, Anxious, on Edge 1  Control/stop worrying 2  Worry too much - different things 2  Trouble relaxing 3  Restless 2  Easily annoyed or irritable 3  Afraid - awful might happen 2  Total GAD 7 Score 15  Anxiety Difficulty Somewhat difficult     No Known Allergies  Prior to Admission medications   Medication Sig Start Date End Date Taking? Authorizing Provider  FLUoxetine  (PROZAC) 40 MG capsule Take 1 capsule (40 mg total) by mouth daily. 01/13/17  Yes Myles LippsSantiago, Natallie Ravenscroft M, MD  levonorgestrel (MIRENA) 20 MCG/24HR IUD 1 each by Intrauterine route once.   Yes [provider]  accutane 20mg  capsule twice a day  Past Medical History:  Diagnosis Date  . Anemia   . Asthma   . Depression     History reviewed. No pertinent surgical history.  Social History   Tobacco Use  . Smoking status: Never Smoker  . Smokeless tobacco: Never Used  Substance Use Topics  . Alcohol use: No    History reviewed. No pertinent family history.  ROS Per hpi  OBJECTIVE:  Blood pressure 112/86, pulse 65, temperature 98.8 F (37.1 C), temperature source Oral, height 5\' 5"  (1.651 m), weight 196 lb 9.6 oz (89.2 kg), SpO2 98 %.  Physical Exam  Constitutional: She is oriented to person, place, and time. She appears well-developed and well-nourished.  HENT:  Head: Normocephalic and atraumatic.  Mouth/Throat: Mucous membranes are normal.  Eyes: Pupils are equal, round, and reactive to light. EOM are normal. No scleral icterus.  Neck: Neck supple.  Pulmonary/Chest: Effort normal.  Neurological: She is alert and oriented to person, place, and time.  Skin: Skin is warm and dry.  Psychiatric: Her affect is blunt.  Nursing note and vitals reviewed.  ASSESSMENT and PLAN  1. Moderate episode of recurrent major depressive disorder (HCC) Not controlled. Transitioning SSRI from fluoxetine to citalopram. Discussed titration to 40mg . Adding vistaril for sleep. Consider adding prazosin. Avoiding trazodone due to theoretical concerns for QTc, consider EKG at next visit. Recommended returning to therapy. - citalopram (CELEXA) 20 MG tablet; Take 1 tablet (20 mg total) by mouth daily. - hydrOXYzine (ATARAX/VISTARIL) 25 MG tablet; Take 1-2 tablets (25-50 mg total) by mouth at bedtime as needed.  Return in about 3 weeks (around 07/19/2017).    Myles Lipps, MD Primary Care at  [facility] [address] [place], [place] [address] Ph.  [phone] Fax [phone]

## 2017-06-28 NOTE — Patient Instructions (Addendum)
Stop fluoxetine and start citalopram. If after 2 weeks of 20mg  of citalopram you do not start to notice improvement in symptoms, then please increase to 40mg  of citalopram once a day.  Consider returning to counseling    IF you received an x-ray today, you will receive an invoice from [facility]. Please contact [facility] at [phone] with questions or concerns regarding your invoice.   IF you received labwork today, you will receive an invoice from [facility]. Please contact [facility] at [phone] with questions or concerns regarding your invoice.   Our billing staff will not be able to assist you with questions regarding bills from these companies.  You will be contacted with the lab results as soon as they are available. The fastest way to get your results is to activate your My Chart account. Instructions are located on the last page of this paperwork. If you have not heard from us regarding the results in 2 weeks, please contact this office.

## 2017-07-18 DIAGNOSIS — L700 Acne vulgaris: Secondary | ICD-10-CM | POA: Diagnosis not present

## 2017-07-18 DIAGNOSIS — Z79899 Other long term (current) drug therapy: Secondary | ICD-10-CM | POA: Diagnosis not present

## 2017-07-19 ENCOUNTER — Other Ambulatory Visit: Payer: Self-pay

## 2017-07-19 ENCOUNTER — Ambulatory Visit: Payer: Commercial Managed Care - PPO | Admitting: Family Medicine

## 2017-07-19 ENCOUNTER — Encounter: Payer: Self-pay | Admitting: Family Medicine

## 2017-07-19 VITALS — BP 109/74 | HR 68 | Temp 99.0°F | Ht 65.0 in | Wt 201.4 lb

## 2017-07-19 DIAGNOSIS — F331 Major depressive disorder, recurrent, moderate: Secondary | ICD-10-CM

## 2017-07-19 MED ORDER — BUPROPION HCL ER (SR) 100 MG PO TB12: 100.0000 mg | ORAL_TABLET | Freq: Every day | ORAL | 1 refills | Status: DC

## 2017-07-19 NOTE — Patient Instructions (Signed)
     IF you received an x-ray today, you will receive an invoice from [facility]. Please contact [facility] at [phone] with questions or concerns regarding your invoice.   IF you received labwork today, you will receive an invoice from [facility]. Please contact [facility] at [phone] with questions or concerns regarding your invoice.   Our billing staff will not be able to assist you with questions regarding bills from these companies.  You will be contacted with the lab results as soon as they are available. The fastest way to get your results is to activate your My Chart account. Instructions are located on the last page of this paperwork. If you have not heard from us regarding the results in 2 weeks, please contact this office.     

## 2017-07-19 NOTE — Progress Notes (Signed)
   7/16/20194:48 PM  Jenny Alvarez 18-Nov-1985, 32 y.o. female [id number]  Chief Complaint  Patient presents with  . Depression    HPI:   Patient is a 32 y.o. female with past medical history significant for depression who presents today for followup  At last visit changed from fluoxetine to celexa 20mg  - no change in mood or decreased libido Added vistaril for sleep, working well, sometimes hard to wake up, nightmares resolved She has gathered list of approved therapist thru her insurance, in process of calling and setting up appt No acute concerns today phq9 noted  Fall Risk  06/28/2017 01/13/2017 11/05/2016  Falls in the past year? No No No     [facility] 2[facility] 07/19/2017 06/28/2017 06/28/2017  Decreased Interest 3 3 0  Down, Depressed, Hopeless 2 2 0  PHQ - 2 Score 5 5 0  Altered sleeping 3 3 -  Tired, decreased energy 3 3 -  Change in appetite 3 3 -  Feeling bad or failure about yourself  1 1 -  Trouble concentrating 2 2 -  Moving slowly or fidgety/restless 1 1 -  Suicidal thoughts 0 0 -  PHQ-9 Score 18 18 -  Difficult doing work/chores - - -    No Known Allergies  Prior to Admission medications   Medication Sig Start Date End Date Taking? Authorizing Provider  citalopram (CELEXA) 20 MG tablet Take 1 tablet (20 mg total) by mouth daily. 06/28/17   Jenny Alvarez, Jenny Alvarez M, MD  hydrOXYzine (ATARAX/VISTARIL) 25 MG tablet Take 1-2 tablets (25-50 mg total) by mouth at bedtime as needed. 06/28/17   Jenny Alvarez, Jenny Alvarez M, MD  ISOtretinoin (ACCUTANE) 20 MG capsule Take 20 mg by mouth 2 (two) times daily.    [provider]  levonorgestrel (MIRENA) 20 MCG/24HR IUD 1 each by Intrauterine route once.    [provider]    Past Medical History:  Diagnosis Date  . Anemia   . Asthma   . Depression     History reviewed. No pertinent surgical history.  Social History   Tobacco Use  . Smoking status: Never Smoker  . Smokeless tobacco: Never Used  Substance  Use Topics  . Alcohol use: No    History reviewed. No pertinent family history.  ROS Per hpi  OBJECTIVE:  Blood pressure 109/74, pulse 68, temperature 99 F (37.2 C), temperature source Oral, height 5\' 5"  (1.651 m), weight 201 lb 6.4 oz (91.4 kg), SpO2 98 %.  Physical Exam  Constitutional: She is oriented to person, place, and time. She appears well-developed and well-nourished.  HENT:  Head: Normocephalic and atraumatic.  Mouth/Throat: Mucous membranes are normal.  Eyes: Pupils are equal, round, and reactive to light. EOM are normal. No scleral icterus.  Neck: Neck supple.  Pulmonary/Chest: Effort normal.  Neurological: She is alert and oriented to person, place, and time.  Skin: Skin is warm and dry.  Psychiatric: She has a normal mood and affect.  Nursing note and vitals reviewed.    ASSESSMENT and PLAN  1. Moderate episode of recurrent major depressive disorder (HCC) Not controlled. augmenting with wellbutrin. New med r/se/b reviewed. Cont to pursue counseling. Other orders - buPROPion (WELLBUTRIN SR) 100 MG 12 hr tablet; Take 1 tablet (100 mg total) by mouth daily.  Return in about 1 month (around 08/16/2017).    Jenny LippsIrma M Santiago, MD Primary Care at [facility] [address] [place], [place] [address] Ph.  [phone] Fax [phone]

## 2017-08-18 DIAGNOSIS — Z79899 Other long term (current) drug therapy: Secondary | ICD-10-CM | POA: Diagnosis not present

## 2017-08-18 DIAGNOSIS — L700 Acne vulgaris: Secondary | ICD-10-CM | POA: Diagnosis not present

## 2017-08-31 DIAGNOSIS — Z79899 Other long term (current) drug therapy: Secondary | ICD-10-CM | POA: Diagnosis not present

## 2017-08-31 DIAGNOSIS — L700 Acne vulgaris: Secondary | ICD-10-CM | POA: Diagnosis not present

## 2017-09-03 ENCOUNTER — Other Ambulatory Visit: Payer: Self-pay

## 2017-09-03 ENCOUNTER — Encounter: Payer: Self-pay | Admitting: Family Medicine

## 2017-09-03 ENCOUNTER — Ambulatory Visit: Payer: Commercial Managed Care - PPO | Admitting: Family Medicine

## 2017-09-03 VITALS — BP 119/79 | HR 74 | Temp 98.5°F | Resp 16 | Ht 65.0 in | Wt 204.0 lb

## 2017-09-03 DIAGNOSIS — F331 Major depressive disorder, recurrent, moderate: Secondary | ICD-10-CM | POA: Diagnosis not present

## 2017-09-03 MED ORDER — BUPROPION HCL ER (XL) 150 MG PO TB24: 150.0000 mg | ORAL_TABLET | Freq: Every day | ORAL | 3 refills | Status: DC

## 2017-09-03 MED ORDER — PRAZOSIN HCL 1 MG PO CAPS: 1.0000 mg | ORAL_CAPSULE | Freq: Every day | ORAL | 3 refills | Status: DC

## 2017-09-03 NOTE — Patient Instructions (Signed)
° ° ° °  If you have lab work done today you will be contacted with your lab results within the next 2 weeks.  If you have not heard from us then please contact us. The fastest way to get your results is to register for My Chart. ° ° °IF you received an x-ray today, you will receive an invoice from [facility]. Please contact [facility] at [phone] with questions or concerns regarding your invoice.  ° °IF you received labwork today, you will receive an invoice from [facility]. Please contact [facility] at [phone] with questions or concerns regarding your invoice.  ° °Our billing staff will not be able to assist you with questions regarding bills from these companies. ° °You will be contacted with the lab results as soon as they are available. The fastest way to get your results is to activate your My Chart account. Instructions are located on the last page of this paperwork. If you have not heard from us regarding the results in 2 weeks, please contact this office. °  ° ° ° °

## 2017-09-03 NOTE — Progress Notes (Signed)
8/31/20199:29 AM  Jenny Alvarez 06-14-1985, 32 y.o. female [id number]  Chief Complaint  Patient presents with  . medication check    1 month recheck on her current medication for depression.  states she is doing much better    HPI:   Patient is a 32 y.o. female with past medical history significant for depression who presents today for followup  Last visit added wellbutrin to celexa Cont hydroxyzine - still having nightmares, difficult to wake up Feels that adding wellbutrin has helped, not feeling as down Still having anhedonia, decreased libido, concentration Sometimes gets a bit overwhelmed  Denies SI No problems with concentration until depression  Fall Risk  06/28/2017 01/13/2017 11/05/2016  Falls in the past year? No No No     Depression screen [facility] 2/9 09/03/2017 09/03/2017 07/19/2017  Decreased Interest 1 (No Data) 3  Down, Depressed, Hopeless 2 - 2  PHQ - 2 Score 3 - 5  Altered sleeping 3 - 3  Tired, decreased energy 3 - 3  Change in appetite 3 - 3  Feeling bad or failure about yourself  2 - 1  Trouble concentrating 3 - 2  Moving slowly or fidgety/restless 1 - 1  Suicidal thoughts 0 - 0  PHQ-9 Score 18 - 18  Difficult doing work/chores Very difficult - -    No Known Allergies  Prior to Admission medications   Medication Sig Start Date End Date Taking? Authorizing Provider  ABSORICA 30 MG capsule Take 30 mg by mouth daily. 07/19/17  Yes [provider]  buPROPion (WELLBUTRIN SR) 100 MG 12 hr tablet Take 1 tablet (100 mg total) by mouth daily. 07/19/17  Yes Myles Lipps, MD  citalopram (CELEXA) 20 MG tablet Take 1 tablet (20 mg total) by mouth daily. 06/28/17  Yes Myles Lipps, MD  hydrOXYzine (ATARAX/VISTARIL) 25 MG tablet Take 1-2 tablets (25-50 mg total) by mouth at bedtime as needed. 06/28/17  Yes Myles Lipps, MD  ISOtretinoin (ACCUTANE) 20 MG capsule Take 20 mg by mouth 2 (two) times daily.   Yes [provider]  levonorgestrel  (MIRENA) 20 MCG/24HR IUD 1 each by Intrauterine route once.   Yes [provider]    Past Medical History:  Diagnosis Date  . Anemia   . Asthma   . Depression     No past surgical history on file.  Social History   Tobacco Use  . Smoking status: Never Smoker  . Smokeless tobacco: Never Used  Substance Use Topics  . Alcohol use: No    No family history on file.  ROS Per hpi  OBJECTIVE:  Blood pressure 119/79, pulse 74, temperature 98.5 F (36.9 C), temperature source Oral, resp. rate 16, height 5\' 5"  (1.651 m), weight 204 lb (92.5 kg), SpO2 98 %. Body mass index is 33.95 kg/m.   Wt Readings from Last 3 Encounters:  09/03/17 204 lb (92.5 kg)  07/19/17 201 lb 6.4 oz (91.4 kg)  06/28/17 196 lb 9.6 oz (89.2 kg)    Physical Exam  Constitutional: She is oriented to person, place, and time. She appears well-developed and well-nourished.  HENT:  Head: Normocephalic and atraumatic.  Mouth/Throat: Mucous membranes are normal.  Eyes: Pupils are equal, round, and reactive to light. Conjunctivae and EOM are normal. No scleral icterus.  Neck: Neck supple.  Pulmonary/Chest: Effort normal.  Neurological: She is alert and oriented to person, place, and time.  Skin: Skin is warm and dry.  Psychiatric: Her affect is blunt.  Nursing note and vitals reviewed.    ASSESSMENT and PLAN  1. Moderate episode of recurrent major depressive disorder (HCC) Uncontrolled, seems to be responding to wellbutrin. Continue to titrate. Changing to XL as tolerating well. Discussed doubling dose to 300mg  in 2 weeks if needed. Changing hydroxyzine to prazosin given nightmares. Is celexa contributing at all?  - buPROPion (WELLBUTRIN XL) 150 MG 24 hr tablet; Take 1 tablet (150 mg total) by mouth daily. - prazosin (MINIPRESS) 1 MG capsule; Take 1-2 capsules (1-2 mg total) by mouth at bedtime.  Return in about 4 weeks (around 10/01/2017).    Myles LippsIrma M Santiago, MD Primary Care at [facility] [address] [place], [place] [address] Ph.  [phone] Fax [phone]

## 2017-10-01 ENCOUNTER — Ambulatory Visit: Payer: Commercial Managed Care - PPO | Admitting: Family Medicine

## 2017-10-13 DIAGNOSIS — L700 Acne vulgaris: Secondary | ICD-10-CM | POA: Diagnosis not present

## 2017-10-13 DIAGNOSIS — Z79899 Other long term (current) drug therapy: Secondary | ICD-10-CM | POA: Diagnosis not present

## 2017-10-14 DIAGNOSIS — L700 Acne vulgaris: Secondary | ICD-10-CM | POA: Diagnosis not present

## 2017-10-14 DIAGNOSIS — Z79899 Other long term (current) drug therapy: Secondary | ICD-10-CM | POA: Diagnosis not present

## 2017-10-17 ENCOUNTER — Ambulatory Visit: Payer: Commercial Managed Care - PPO | Admitting: Family Medicine

## 2017-10-17 ENCOUNTER — Other Ambulatory Visit: Payer: Self-pay

## 2017-10-17 ENCOUNTER — Encounter: Payer: Self-pay | Admitting: Family Medicine

## 2017-10-17 VITALS — BP 127/83 | HR 62 | Temp 98.6°F | Ht 65.0 in | Wt 202.6 lb

## 2017-10-17 DIAGNOSIS — F515 Nightmare disorder: Secondary | ICD-10-CM | POA: Diagnosis not present

## 2017-10-17 DIAGNOSIS — R5383 Other fatigue: Secondary | ICD-10-CM | POA: Diagnosis not present

## 2017-10-17 DIAGNOSIS — F331 Major depressive disorder, recurrent, moderate: Secondary | ICD-10-CM

## 2017-10-17 MED ORDER — BUPROPION HCL ER (XL) 300 MG PO TB24: 300.0000 mg | ORAL_TABLET | Freq: Every day | ORAL | 1 refills | Status: DC

## 2017-10-17 MED ORDER — PRAZOSIN HCL 1 MG PO CAPS: 1.0000 mg | ORAL_CAPSULE | Freq: Every day | ORAL | 1 refills | Status: DC

## 2017-10-17 NOTE — Progress Notes (Signed)
10/14/201912:02 PM  Jenny Alvarez [birth year], 32 y.o. female [id number]  Chief Complaint  Patient presents with  . Depression    HPI:   Patient is a 32 y.o. female with past medical history significant for depression who presents today for followup  Last visit changed wellbutrin to XL, advised she could increase to 300mg  if needed Changed hydroxyzine to prazosin for nightmares  Nightmares are controlled with prazosin with 1mg  Still continues to having fatigue Having more irritability or recent, people starting to comment on it, more prominent in the evening Continues to have issues with maintaining focus and completing tasks at work due to lack of motivation She never increased wellbutrin to 300mg   Has set up appt with therapist at the end of this month  Fall Risk  06/28/2017 01/13/2017 11/05/2016  Falls in the past year? No No No     Depression screen [facility] 2/9 10/17/2017 09/03/2017 09/03/2017  Decreased Interest 2 1 (No Data)  Down, Depressed, Hopeless 1 2 -  PHQ - 2 Score 3 3 -  Altered sleeping 2 3 -  Tired, decreased energy 3 3 -  Change in appetite 1 3 -  Feeling bad or failure about yourself  1 2 -  Trouble concentrating 2 3 -  Moving slowly or fidgety/restless 1 1 -  Suicidal thoughts 0 0 -  PHQ-9 Score 13 18 -  Difficult doing work/chores Somewhat difficult Very difficult -    No Known Allergies  Prior to Admission medications   Medication Sig Start Date End Date Taking? Authorizing Provider  ABSORICA 30 MG capsule Take 30 mg by mouth daily. 07/19/17   [provider]  buPROPion (WELLBUTRIN XL) 150 MG 24 hr tablet Take 1 tablet (150 mg total) by mouth daily. 09/03/17   Myles Lipps, MD  citalopram (CELEXA) 20 MG tablet Take 1 tablet (20 mg total) by mouth daily. 06/28/17   Myles Lipps, MD  ISOtretinoin (ACCUTANE) 20 MG capsule Take 20 mg by mouth 2 (two) times daily.    [provider]  levonorgestrel (MIRENA) 20 MCG/24HR IUD 1 each  by Intrauterine route once.    [provider]  prazosin (MINIPRESS) 1 MG capsule Take 1-2 capsules (1-2 mg total) by mouth at bedtime. 09/03/17   Myles Lipps, MD    Past Medical History:  Diagnosis Date  . Anemia   . Asthma   . Depression     No past surgical history on file.  Social History   Tobacco Use  . Smoking status: Never Smoker  . Smokeless tobacco: Never Used  Substance Use Topics  . Alcohol use: No    No family history on file.  ROS Per hpi  OBJECTIVE:  Blood pressure 127/83, pulse 62, temperature 98.6 F (37 C), temperature source Oral, height 5\' 5"  (1.651 m), weight 202 lb 9.6 oz (91.9 kg), SpO2 98 %. Body mass index is 33.71 kg/m.  Wt Readings from Last 3 Encounters:  10/17/17 202 lb 9.6 oz (91.9 kg)  09/03/17 204 lb (92.5 kg)  07/19/17 201 lb 6.4 oz (91.4 kg)     Physical Exam  Constitutional: She is oriented to person, place, and time. She appears well-developed and well-nourished.  HENT:  Head: Normocephalic and atraumatic.  Mouth/Throat: Mucous membranes are normal.  Eyes: Pupils are equal, round, and reactive to light. Conjunctivae and EOM are normal. No scleral icterus.  Neck: Neck supple.  Pulmonary/Chest: Effort normal.  Neurological: She is alert and oriented to  person, place, and time.  Skin: Skin is warm and dry.  Psychiatric: She has a normal mood and affect.  Nursing note and vitals reviewed.    ASSESSMENT and PLAN  1. Moderate episode of recurrent major depressive disorder (HCC) Improved. Increasing wellbutrin. Consider augmenting with low dose stimulant if needed. Consider psychiatry referral  2. Fatigue, unspecified type - CBC - TSH  3. Nightmares Controlled. Continue with prazosin  Other orders - buPROPion (WELLBUTRIN XL) 300 MG 24 hr tablet; Take 1 tablet (300 mg total) by mouth daily. - prazosin (MINIPRESS) 1 MG capsule; Take 1-2 capsules (1-2 mg total) by mouth at bedtime.  Return in about 4 weeks  (around 11/14/2017) for depression.    Myles Lipps, MD Primary Care at [facility] [address] [place], [place] [address] Ph.  [phone] Fax [phone]

## 2017-10-17 NOTE — Patient Instructions (Signed)
° ° ° °  If you have lab work done today you will be contacted with your lab results within the next 2 weeks.  If you have not heard from us then please contact us. The fastest way to get your results is to register for My Chart. ° ° °IF you received an x-ray today, you will receive an invoice from [facility]. Please contact [facility] at [phone] with questions or concerns regarding your invoice.  ° °IF you received labwork today, you will receive an invoice from [facility]. Please contact [facility] at [phone] with questions or concerns regarding your invoice.  ° °Our billing staff will not be able to assist you with questions regarding bills from these companies. ° °You will be contacted with the lab results as soon as they are available. The fastest way to get your results is to activate your My Chart account. Instructions are located on the last page of this paperwork. If you have not heard from us regarding the results in 2 weeks, please contact this office. °  ° ° ° °

## 2017-10-18 LAB — CBC
Hematocrit: 36.5 % (ref 34.0–46.6)
Hemoglobin: 12.6 g/dL (ref 11.1–15.9)
MCH: 31.4 pg (ref 26.6–33.0)
MCHC: 34.5 g/dL (ref 31.5–35.7)
MCV: 91 fL (ref 79–97)
Platelets: 266 10*3/uL (ref 150–450)
RBC: 4.01 x10E6/uL (ref 3.77–5.28)
RDW: 12.9 % (ref 12.3–15.4)
WBC: 7.5 10*3/uL (ref 3.4–10.8)

## 2017-10-18 LAB — TSH: TSH: 2.17 u[IU]/mL (ref 0.450–4.500)

## 2017-11-18 ENCOUNTER — Ambulatory Visit: Payer: Commercial Managed Care - PPO | Admitting: Family Medicine

## 2017-12-06 ENCOUNTER — Other Ambulatory Visit: Payer: Self-pay | Admitting: *Deleted

## 2017-12-06 ENCOUNTER — Encounter: Payer: Self-pay | Admitting: Family Medicine

## 2017-12-06 MED ORDER — CITALOPRAM HYDROBROMIDE 20 MG PO TABS: 20.0000 mg | ORAL_TABLET | Freq: Every day | ORAL | 0 refills | Status: DC

## 2017-12-20 DIAGNOSIS — L700 Acne vulgaris: Secondary | ICD-10-CM | POA: Diagnosis not present

## 2017-12-27 ENCOUNTER — Ambulatory Visit (INDEPENDENT_AMBULATORY_CARE_PROVIDER_SITE_OTHER): Payer: Commercial Managed Care - PPO | Admitting: Family Medicine

## 2017-12-27 ENCOUNTER — Encounter: Payer: Self-pay | Admitting: Family Medicine

## 2017-12-27 VITALS — BP 117/80 | HR 69 | Temp 98.3°F | Resp 16 | Ht 65.0 in | Wt 203.0 lb

## 2017-12-27 DIAGNOSIS — F331 Major depressive disorder, recurrent, moderate: Secondary | ICD-10-CM

## 2017-12-27 MED ORDER — CITALOPRAM HYDROBROMIDE 40 MG PO TABS: 40.0000 mg | ORAL_TABLET | Freq: Every day | ORAL | 0 refills | Status: DC

## 2017-12-27 MED ORDER — BUPROPION HCL ER (XL) 300 MG PO TB24: 300.0000 mg | ORAL_TABLET | Freq: Every day | ORAL | 1 refills | Status: DC

## 2017-12-27 NOTE — Progress Notes (Signed)
12/24/201910:19 AM  Jenny Alvarez July 09, 1985, 32 y.o. female [id number]  Chief Complaint  Patient presents with  . Medication Refill    Celexa and Wellbutrin  . Depression    HPI:   Patient is a 32 y.o. female with past medical history significant for depression who presents today for routine followup  Last OV oct 2019 Increased wellbutrin  Minimal progress Did see a therpaist, did not have a good connection  Started going to [employer]-An[employer], feels well supported Has been able to take prazosin prn, as sleep is better Still having significant anhedonia Getting off accutane in 6 more weeks Denies SI  Fall Risk  10/17/2017 06/28/2017 01/13/2017 11/05/2016  Falls in the past year? No No No No     [facility] 2/9 12/27/2017 12/27/2017 10/17/2017  Decreased Interest 3 3 2   Down, Depressed, Hopeless 1 - 1  PHQ - 2 Score 4 3 3   Altered sleeping 2 2 2   Tired, decreased energy 3 3 3   Change in appetite 2 2 1   Feeling bad or failure about yourself  1 1 1   Trouble concentrating 1 1 2   Moving slowly or fidgety/restless 0 0 1  Suicidal thoughts 0 0 0  PHQ-9 Score 13 12 13   Difficult doing work/chores Very difficult Very difficult Somewhat difficult    No Known Allergies  Prior to Admission medications   Medication Sig Start Date End Date Taking? Authorizing Provider  ABSORICA 30 MG capsule Take 30 mg by mouth daily. 07/19/17  Yes [provider]  buPROPion (WELLBUTRIN XL) 300 MG 24 hr tablet Take 1 tablet (300 mg total) by mouth daily. 10/17/17  Yes Myles LippsSantiago, Leiya Keesey M, MD  citalopram (CELEXA) 20 MG tablet Take 1 tablet (20 mg total) by mouth daily. 12/06/17  Yes Myles LippsSantiago, Lorinda Copland M, MD  ISOtretinoin (ACCUTANE) 20 MG capsule Take 20 mg by mouth 2 (two) times daily.   Yes [provider]  levonorgestrel (MIRENA) 20 MCG/24HR IUD 1 each by Intrauterine route once.   Yes [provider]  prazosin (MINIPRESS) 1 MG capsule Take 1-2 capsules (1-2 mg total) by  mouth at bedtime. 10/17/17  Yes Myles LippsSantiago, Abdiaziz Klahn M, MD    Past Medical History:  Diagnosis Date  . Anemia   . Asthma   . Depression     No past surgical history on file.  Social History   Tobacco Use  . Smoking status: Never Smoker  . Smokeless tobacco: Never Used  Substance Use Topics  . Alcohol use: No    No family history on file.  ROS Per hpi  OBJECTIVE:  Blood pressure 117/80, pulse 69, temperature 98.3 F (36.8 C), temperature source Oral, resp. rate 16, height 5\' 5"  (1.651 m), weight 203 lb (92.1 kg), SpO2 96 %. Body mass index is 33.78 kg/m.   Wt Readings from Last 3 Encounters:  12/27/17 203 lb (92.1 kg)  10/17/17 202 lb 9.6 oz (91.9 kg)  09/03/17 204 lb (92.5 kg)    Physical Exam Vitals signs and nursing note reviewed.  Constitutional:      Appearance: She is well-developed.  HENT:     Head: Normocephalic and atraumatic.  Eyes:     General: No scleral icterus.    Conjunctiva/sclera: Conjunctivae normal.     Pupils: Pupils are equal, round, and reactive to light.  Neck:     Musculoskeletal: Neck supple.  Pulmonary:     Effort: Pulmonary effort is normal.  Skin:    General: Skin is  warm and dry.  Neurological:     Mental Status: She is alert and oriented to person, place, and time.      ASSESSMENT and PLAN  1. Moderate episode of recurrent major depressive disorder (HCC) Uncontrolled, not improving. maximizing celexa. Referring to psych - Ambulatory referral to Psychiatry  Other orders - citalopram (CELEXA) 40 MG tablet; Take 1 tablet (40 mg total) by mouth daily. - buPROPion (WELLBUTRIN XL) 300 MG 24 hr tablet; Take 1 tablet (300 mg total) by mouth daily.  Return in about 3 months (around 03/28/2018).    Myles LippsIrma M Santiago, MD Primary Care at [facility] [address] [place], [place] [address] Ph.  [phone] Fax [phone]

## 2017-12-27 NOTE — Patient Instructions (Signed)
° ° ° °  If you have lab work done today you will be contacted with your lab results within the next 2 weeks.  If you have not heard from us then please contact us. The fastest way to get your results is to register for My Chart. ° ° °IF you received an x-ray today, you will receive an invoice from [facility]. Please contact  Radiology at [phone] with questions or concerns regarding your invoice.  ° °IF you received labwork today, you will receive an invoice from [facility]. Please contact [facility] at [phone] with questions or concerns regarding your invoice.  ° °Our billing staff will not be able to assist you with questions regarding bills from these companies. ° °You will be contacted with the lab results as soon as they are available. The fastest way to get your results is to activate your My Chart account. Instructions are located on the last page of this paperwork. If you have not heard from us regarding the results in 2 weeks, please contact this office. °  ° ° ° °

## 2018-01-26 DIAGNOSIS — L853 Xerosis cutis: Secondary | ICD-10-CM | POA: Diagnosis not present

## 2018-01-26 DIAGNOSIS — L700 Acne vulgaris: Secondary | ICD-10-CM | POA: Diagnosis not present

## 2018-01-26 DIAGNOSIS — Z79899 Other long term (current) drug therapy: Secondary | ICD-10-CM | POA: Diagnosis not present

## 2018-02-01 DIAGNOSIS — Z79899 Other long term (current) drug therapy: Secondary | ICD-10-CM | POA: Diagnosis not present

## 2018-02-01 DIAGNOSIS — L700 Acne vulgaris: Secondary | ICD-10-CM | POA: Diagnosis not present

## 2018-02-13 DIAGNOSIS — J019 Acute sinusitis, unspecified: Secondary | ICD-10-CM | POA: Diagnosis not present

## 2018-02-15 ENCOUNTER — Other Ambulatory Visit: Payer: Self-pay | Admitting: Family Medicine

## 2018-02-16 MED ORDER — CITALOPRAM HYDROBROMIDE 40 MG PO TABS: 40.0000 mg | ORAL_TABLET | Freq: Every day | ORAL | 0 refills | Status: DC

## 2018-02-25 ENCOUNTER — Encounter (HOSPITAL_COMMUNITY): Payer: Self-pay | Admitting: Psychiatry

## 2018-02-25 ENCOUNTER — Ambulatory Visit (INDEPENDENT_AMBULATORY_CARE_PROVIDER_SITE_OTHER): Payer: Commercial Managed Care - PPO | Admitting: Psychiatry

## 2018-02-25 VITALS — BP 122/70 | Ht 65.0 in | Wt 199.0 lb

## 2018-02-25 DIAGNOSIS — F331 Major depressive disorder, recurrent, moderate: Secondary | ICD-10-CM

## 2018-02-25 MED ORDER — VILAZODONE HCL 20 MG PO TABS: 20.0000 mg | ORAL_TABLET | Freq: Every day | ORAL | 0 refills | Status: DC

## 2018-02-25 MED ORDER — PRAZOSIN HCL 2 MG PO CAPS: 2.0000 mg | ORAL_CAPSULE | Freq: Every day | ORAL | 0 refills | Status: DC

## 2018-02-25 NOTE — Patient Instructions (Signed)
Plan:  1. Decrease citalopram (Celexa) to 20 mg daily x 7 days then stop.  2. At the same time start taking Viibryd 10 mg daily for 7 days then increase dose to 20 mg  3. We will increase dose of prazosin to 2 mg at bedtime.  4. We are changing Wellbutrin to SR form. Take one 150 mg tablet in AM the next one around 2-3 pm.
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
## 2018-03-02 DIAGNOSIS — Z79899 Other long term (current) drug therapy: Secondary | ICD-10-CM | POA: Diagnosis not present

## 2018-03-02 DIAGNOSIS — L700 Acne vulgaris: Secondary | ICD-10-CM | POA: Diagnosis not present

## 2018-03-27 ENCOUNTER — Ambulatory Visit (HOSPITAL_COMMUNITY): Payer: Commercial Managed Care - PPO | Admitting: Psychiatry

## 2018-03-28 ENCOUNTER — Ambulatory Visit (HOSPITAL_COMMUNITY): Payer: Commercial Managed Care - PPO | Admitting: Psychiatry

## 2018-03-30 ENCOUNTER — Ambulatory Visit: Payer: Commercial Managed Care - PPO | Admitting: Family Medicine

## 2018-03-31 ENCOUNTER — Other Ambulatory Visit: Payer: Self-pay

## 2018-03-31 ENCOUNTER — Telehealth (INDEPENDENT_AMBULATORY_CARE_PROVIDER_SITE_OTHER): Payer: Commercial Managed Care - PPO | Admitting: Psychiatry

## 2018-03-31 DIAGNOSIS — F331 Major depressive disorder, recurrent, moderate: Secondary | ICD-10-CM | POA: Diagnosis not present

## 2018-03-31 MED ORDER — VILAZODONE HCL 20 MG PO TABS: 20.0000 mg | ORAL_TABLET | Freq: Every day | ORAL | 2 refills | Status: DC

## 2018-03-31 MED ORDER — BUPROPION HCL ER (SR) 200 MG PO TB12: 200.0000 mg | ORAL_TABLET | Freq: Two times a day (BID) | ORAL | 0 refills | Status: DC

## 2018-03-31 NOTE — Progress Notes (Signed)
BH MD/PA/NP OP Progress Note  03/31/2018 9:22 AM Jenny Alvarez  MRN:  [id number]  Interview was conducted using teleconferencing and I verified that I was speaking with the correct person using two identifiers. I discussed the limitations of evaluation and management by telemedicine and  the availability of in person appointments. Patient expressed understanding and agreed to proceed.  Chief Complaint:  Some residual anxiety, fatigue, low libido  HPI: 33 yo female with hx of depression/anxiety and some residual sx of PTSD (nightmares) who has so far failed to respond to fluoxetine, then combination of citalopram and bupropion. We have started her on Viibryd instead of Celexa while continuing Wellbutrin XL 300 mg. She reports being less depressed and anxious but still feels tired, finds it difficult to concentrate and has unchanged (low) libido. She reports that her sleep improved overall (able to fall asleep better and sleeps longer 5-6 hours uninterrupted) but she continues to have episodic (about twice a week) nightmares despite doubling the dose of prazosin. She reports some mild dizziness in AM likely secondary to that medication.. No hopelessness, no SI. She now does not go to her part time job while her main employment was cut down to two days per week (less stress there but more financial worries).    Visit Diagnosis:    ICD-10-CM   1. Moderate episode of recurrent major depressive disorder (HCC) F33.1     Past Psychiatric History: Please see intake H&P.  Past Medical History:  Past Medical History:  Diagnosis Date  . Anemia   . Asthma   . Depression    No past surgical history on file.  Family Psychiatric History: Rveiwed  Family History:  Family History  Problem Relation Age of Onset  . Alcohol abuse Mother   . Depression Mother     Social History:  Social History   Socioeconomic History  . Marital status: Single    Spouse name: Not on file  . Number of  children: Not on file  . Years of education: Not on file  . Highest education level: Not on file  Occupational History  . Not on file  Social Needs  . Financial resource strain: Not on file  . Food insecurity:    Worry: Not on file    Inability: Not on file  . Transportation needs:    Medical: Not on file    Non-medical: Not on file  Tobacco Use  . Smoking status: Never Smoker  . Smokeless tobacco: Never Used  Substance and Sexual Activity  . Alcohol use: No  . Drug use: Never  . Sexual activity: Yes    Birth control/protection: I.U.D.  Lifestyle  . Physical activity:    Days per week: Not on file    Minutes per session: Not on file  . Stress: Not on file  Relationships  . Social connections:    Talks on phone: Not on file    Gets together: Not on file    Attends religious service: Not on file    Active member of club or organization: Not on file    Attends meetings of clubs or organizations: Not on file    Relationship status: Not on file  Other Topics Concern  . Not on file  Social History Narrative   ** Merged History Encounter **        Allergies: No Known Allergies  Metabolic Disorder Labs: No results found for: HGBA1C, MPG No results found for: PROLACTIN No results found for: CHOL,  TRIG, HDL, CHOLHDL, VLDL, LDLCALC Lab Results  Component Value Date   TSH 2.170 10/17/2017   TSH 1.500 11/05/2016    Therapeutic Level Labs: No results found for: LITHIUM No results found for: VALPROATE No components found for:  CBMZ  Current Medications: Current Outpatient Medications  Medication Sig Dispense Refill  . ABSORICA 30 MG capsule Take 30 mg by mouth daily.  0  . buPROPion (WELLBUTRIN SR) 200 MG 12 hr tablet Take 1 tablet (200 mg total) by mouth 2 (two) times daily. Take one tablet in AM, second one around 3 PM 180 tablet 0  . ISOtretinoin (ACCUTANE) 20 MG capsule Take 20 mg by mouth 2 (two) times daily.    Marland Kitchen levonorgestrel (MIRENA) 20 MCG/24HR IUD 1 each by  Intrauterine route once.    . prazosin (MINIPRESS) 2 MG capsule Take 1 capsule (2 mg total) by mouth at bedtime. 90 capsule 0  . Vilazodone HCl 20 MG TABS Take 1 tablet (20 mg total) by mouth daily. 30 tablet 2   No current facility-administered medications for this visit.      Psychiatric Specialty Exam: Review of Systems  Psychiatric/Behavioral: Positive for depression. The patient has insomnia.   All other systems reviewed and are negative.   There were no vitals taken for this visit.There is no height or weight on file to calculate BMI.  General Appearance: NA  Eye Contact:  NA  Speech:  Clear and Coherent  Volume:  Normal  Mood:  Anxious  Affect:  NA  Thought Process:  Coherent and Goal Directed  Orientation:  Full (Time, Place, and Person)  Thought Content: Logical   Suicidal Thoughts:  No  Homicidal Thoughts:  No  Memory:  Immediate;   Good Recent;   Good Remote;   Good  Judgement:  Intact  Insight:  Good  Psychomotor Activity:  NA  Concentration:  Concentration: Fair  Recall:  Good  Fund of Knowledge: Good  Language: Good  Akathisia:  NA  Handed:  Right  AIMS (if indicated): not done  Assets:  Communication Skills Desire for Improvement Financial Resources/Insurance Housing Physical Health  ADL's:  Intact  Cognition: WNL  Sleep:  Fair   Screenings: GAD-7     Office Visit from 06/28/2017 in Primary Care at [place]  Total GAD-7 Score  15    PHQ2-9     Office Visit from 12/27/2017 in Primary Care at [facility] Visit from 10/17/2017 in Primary Care at [facility] Visit from 09/03/2017 in Primary Care at [facility] Visit from 07/19/2017 in Primary Care at [facility] Visit from 06/28/2017 in Primary Care at [facility] Total Score  PHQ-9 Total Score  Assessment and Plan: 33 yo female with hx of major depression/anxiety and some residual sx of PTSD (nightmares) who has so far failed to respond to fluoxetine,  then combination of citalopram and bupropion. We have started her on Viibryd instead of Celexa while continuing Wellbutrin XL 300 mg. She reports being less depressed and anxious but still feels tired, finds it difficult to concentrate and has unchanged (low) libido. She reports that her sleep improved overall (able to fall asleep better and sleeps longer 5-6 hours uninterrupted) but she continues to have episodic (about twice a week) nightmares despite doubling the dose of prazosin. She reports some mild dizziness in AM likely secondary to  that medication.. No hopelessness, no SI. She now does not go to her part time job while her main employment was cut down to two days per week (less stress there but more financial worries).   Plan: We will continue Viibryd and prazosin unchanged at this time but consider changing it next to SR 200 mg bid to help with energy/concentration. We will increase dose of prazosin for nightmares to 2 mg. Next follow up visit in 2 months or prn.   Magdalene Patricia, MD 03/31/2018, 9:22 AM

## 2018-04-04 DIAGNOSIS — Z79899 Other long term (current) drug therapy: Secondary | ICD-10-CM | POA: Diagnosis not present

## 2018-04-04 DIAGNOSIS — L700 Acne vulgaris: Secondary | ICD-10-CM | POA: Diagnosis not present

## 2018-05-23 ENCOUNTER — Other Ambulatory Visit (HOSPITAL_COMMUNITY): Payer: Self-pay | Admitting: Psychiatry

## 2018-05-23 ENCOUNTER — Telehealth (HOSPITAL_COMMUNITY): Payer: Self-pay

## 2018-05-23 NOTE — Telephone Encounter (Signed)
Patient called regarding her Vilazodone 20mg . It was sent in 03/31/18 #30 with 2 refills. She stated she has enough until her scheduled appointment on 06/05/18. It was filled through [facility], however it costs her $75 a month and she stated she cannot afford that. I tried to get her on a discount program through [facility] but they stated that [facility] just filled her Vilazodone on 05/16/18. Therefore, I held off on filling it with [facility] for the time being. I will try again once she is need of the medication.

## 2018-06-01 ENCOUNTER — Telehealth (HOSPITAL_COMMUNITY): Payer: Self-pay | Admitting: Psychiatry

## 2018-06-05 ENCOUNTER — Other Ambulatory Visit: Payer: Self-pay

## 2018-06-05 ENCOUNTER — Ambulatory Visit (INDEPENDENT_AMBULATORY_CARE_PROVIDER_SITE_OTHER): Payer: Commercial Managed Care - PPO | Admitting: Psychiatry

## 2018-06-05 DIAGNOSIS — F331 Major depressive disorder, recurrent, moderate: Secondary | ICD-10-CM | POA: Diagnosis not present

## 2018-06-05 MED ORDER — VILAZODONE HCL 40 MG PO TABS: 40.0000 mg | ORAL_TABLET | Freq: Every day | ORAL | 2 refills | Status: DC

## 2018-06-05 MED ORDER — PRAZOSIN HCL 2 MG PO CAPS: 2.0000 mg | ORAL_CAPSULE | Freq: Every day | ORAL | 0 refills | Status: DC

## 2018-06-05 MED ORDER — BUPROPION HCL ER (SR) 200 MG PO TB12: 200.0000 mg | ORAL_TABLET | Freq: Two times a day (BID) | ORAL | 0 refills | Status: DC

## 2018-06-05 NOTE — Progress Notes (Signed)
BH MD/PA/NP OP Progress Note  06/05/2018 8:41 AM Jenny Alvarez [name]  MRN:  [id number] Interview was conducted by phone and I verified that I was speaking with the correct person using two identifiers. I discussed the limitations of evaluation and management by telemedicine and  the availability of in person appointments. Patient expressed understanding and agreed to proceed.  Chief Complaint: Anxiety/depression  HPI: 33 yo female with hx of depression/anxiety and some residual sx of PTSD (nightmares) who has so far failed to respond to fluoxetine, then combination of citalopram and bupropion. She remains depressed, anxious, tired, difficulty with concentration, episodic nightmares. No hopelessness, no SI. No hx of mania or psychosis. Stressful job (actually two) which she does not enjoy much. Low libido may reflect depression or adverse efect of citalopram. We discussed various treatment options and decided to change citalopram to vilazodone which is much less likely to contribute to fatigue and loss of libido. Starter pack for 7 days of 10 mg followed by 7 days of 20 mg given (plus Rx for 20 mg). We will continue bupropion unchanged at this time but consider changing it next to SR 200 mg bid to help with energy/concentration. We increased dose of prazosin for nightmares to 2 mg. Jenny tolerates Viibryd well, no adverse effects, libido is coming back. Mood still a little depressed/anious. The issue is the cost of [facility]yd [facility] We will try [facility].  Visit Diagnosis: 2   ICD-10-CM   1. Moderate episode of recurrent major depressive disorder (HCC) F33.1     Past Psychiatric History: Please refer to intake H&P.  Past Medical History:  Past Medical History:  Diagnosis Date  . Anemia   . Asthma   . Depression    No past surgical history on file.  Family Psychiatric History: [place]iewees.  Family History:  Family History  Problem Relation Age of Onset  . Alcohol abuse Mother    . Depression Mother     Social History:  Social History   Socioeconomic History  . Marital status: Single    Spouse name: Not on file  . Number of children: Not on file  . Years of education: Not on file  . Highest education level: Not on file  Occupational History  . Not on file  Social Needs  . Financial resource strain: Not on file  . Food insecurity:    Worry: Not on file    Inability: Not on file  . Transportation needs:    Medical: Not on file    Non-medical: Not on file  Tobacco Use  . Smoking status: Never Smoker  . Smokeless tobacco: Never Used  Substance and Sexual Activity  . Alcohol use: No  . Drug use: Never  . Sexual activity: Yes    Birth control/protection: I.U.D.  Lifestyle  . Physical activity:    Days per week: Not on file    Minutes per session: Not on file  . Stress: Not on file  Relationships  . Social connections:    Talks on phone: Not on file    Gets together: Not on file    Attends religious service: Not on file    Active member of club or organization: Not on file    Attends meetings of clubs or organizations: Not on file    Relationship status: Not on file  Other Topics Concern  . Not on file  Social History Narrative   ** Merged History Encounter **  Allergies: No Known Allergies  Metabolic Disorder Labs: No results found for: HGBA1C, MPG No results found for: PROLACTIN No results found for: CHOL, TRIG, HDL, CHOLHDL, VLDL, LDLCALC Lab Results  Component Value Date   TSH 2.170 10/17/2017   TSH 1.500 11/05/2016    Therapeutic Level Labs: No results found for: LITHIUM No results found for: VALPROATE No components found for:  CBMZ  Current Medications: Current Outpatient Medications  Medication Sig Dispense Refill  . ABSORICA 30 MG capsule Take 30 mg by mouth daily.  0  . buPROPion (WELLBUTRIN SR) 200 MG 12 hr tablet Take 1 tablet (200 mg total) by mouth 2 (two) times daily. Take one tablet in AM, second one  around 3 PM 180 tablet 0  . ISOtretinoin (ACCUTANE) 20 MG capsule Take 20 mg by mouth 2 (two) times daily.    Marland Kitchen levonorgestrel (MIRENA) 20 MCG/24HR IUD 1 each by Intrauterine route once.    . prazosin (MINIPRESS) 2 MG capsule Take 1 capsule (2 mg total) by mouth at bedtime. 90 capsule 0  . Vilazodone HCl 20 MG TABS Take 1 tablet (20 mg total) by mouth daily. 30 tablet 2   No current facility-administered medications for this visit.      Psychiatric Specialty Exam: Review of Systems  Psychiatric/Behavioral: Positive for depression. The patient is nervous/anxious.   All other systems reviewed and are negative.   There were no vitals taken for this visit.There is no height or weight on file to calculate BMI.  General Appearance: NA  Eye Contact:  NA  Speech:  Clear and Coherent  Volume:  Normal  Mood:  Anxious  Affect:  NA  Thought Process:  Goal Directed  Orientation:  Full (Time, Place, and Person)  Thought Content: Logical   Suicidal Thoughts:  No  Homicidal Thoughts:  No  Memory:  Immediate;   Good Recent;   Good Remote;   Good  Judgement:  Good  Insight:  Good  Psychomotor Activity:  NA  Concentration:  Concentration: Good  Recall:  Good  Fund of Knowledge: Good  Language: Good  Akathisia:  NA  Handed:  Right  AIMS (if indicated): not done  Assets:  Communication Skills Desire for Improvement Housing Intimacy Physical Health  ADL's:  Intact  Cognition: WNL  Sleep:  Good   Screenings: GAD-7     Office Visit from 06/28/2017 in Primary Care at [facility]  Total GAD-7 Score  15    PHQ2-9     Office Visit from 12/27/2017 in Primary Care at [facility] Visit from 10/17/2017 in Primary Care at [facility] Visit from 09/03/2017 in Primary Care at [facility] Visit from 07/19/2017 in Primary Care at [facility] Visit from 06/28/2017 in Primary Care at [facility] Total Score  PHQ-9 Total Score  Assessment and Plan: 33 yo  female with hx of depression/anxiety and some residual sx of PTSD (nightmares) who has so far failed to respond to fluoxetine, then combination of citalopram and bupropion. She remains depressed, anxious, tired, difficulty with concentration, episodic nightmares. No hopelessness, no SI. No hx of mania or psychosis. Stressful job (actually two) which she does not enjoy much. Low libido may reflect depression or adverse efect of citalopram. We discussed various treatment options and decided to change citalopram to vilazodone which is much less likely to contribute to fatigue  and loss of libido. Starter pack for 7 days of 10 mg followed by 7 days of 20 mg given (plus Rx for 20 mg). We will continue bupropion unchanged at this time but consider changing it next to SR 200 mg bid to help with energy/concentration. We increased dose of prazosin for nightmares to 2 mg. Jenny tolerates Viibryd well, no adverse effects, libido is coming back. Mood still a little depressed/anious. The issue is the cost of Viibryyd $75/month at [facility]. We will try to increase dose to 40 mg and try [facility]. Next appointment in 3 months.    Jenny Patricia, MD 06/05/2018, 8:41 AM

## 2018-06-08 NOTE — Telephone Encounter (Signed)
She will need a new refill on Viibryd (I send it to [facility] yesterday).

## 2018-07-17 DIAGNOSIS — F32A Depression, unspecified: Secondary | ICD-10-CM | POA: Insufficient documentation

## 2018-09-05 ENCOUNTER — Ambulatory Visit (INDEPENDENT_AMBULATORY_CARE_PROVIDER_SITE_OTHER): Payer: Commercial Managed Care - PPO | Admitting: Psychiatry

## 2018-09-05 ENCOUNTER — Other Ambulatory Visit: Payer: Self-pay

## 2018-09-05 DIAGNOSIS — F3341 Major depressive disorder, recurrent, in partial remission: Secondary | ICD-10-CM

## 2018-09-05 MED ORDER — BUPROPION HCL ER (SR) 200 MG PO TB12: 200.0000 mg | ORAL_TABLET | Freq: Two times a day (BID) | ORAL | 1 refills | Status: DC

## 2018-09-05 MED ORDER — PRAZOSIN HCL 2 MG PO CAPS: 2.0000 mg | ORAL_CAPSULE | Freq: Every day | ORAL | 1 refills | Status: DC

## 2018-09-05 MED ORDER — VILAZODONE HCL 40 MG PO TABS: 40.0000 mg | ORAL_TABLET | Freq: Every day | ORAL | 1 refills | Status: DC

## 2018-09-05 NOTE — Progress Notes (Signed)
BH MD/PA/NP OP Progress Note  09/05/2018 8:46 AM Jenny Alvarez  MRN:  [id number] Interview was conducted by phone and I verified that I was speaking with the correct person using two identifiers. I discussed the limitations of evaluation and management by telemedicine and  the availability of in person appointments. Patient expressed understanding and agreed to proceed.  Chief Complaint: "I am doing well".  HPI: 33 yo female with hx of depression/anxiety and some residual sx of PTSD (nightmares) who has previously failed to respond to fluoxetine, then combination of citalopram and bupropion. We have then started vilazodone/bupropion combination and Jenny's mood improved significantly. She denies feeling depressed, anxious, tired, or having difficulty with concentration. Episodic nightmares resolved on an increased dose of prazosin. 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 Problems with libido resolved once she came off citalopram. [facility] of [facility]  Visit Diagnosis:    ICD-10-CM   1. Major depressive disorder, recurrent episode, in partial remission (HCC)  F33.41     Past Psychiatric History: Please see intake H&P.  Past Medical History:  Past Medical History:  Diagnosis Date  . Anemia   . Asthma   . Depression    No past surgical history on file.  Family Psychiatric History: Reviewed.  Family History:  Family History  Problem Relation Age of Onset  . Alcohol abuse Mother   . Depression Mother     Social History:  Social History   Socioeconomic History  . Marital status: Single    Spouse name: Not on file  . Number of children: Not on file  . Years of education: Not on file  . Highest education level: Not on file  Occupational History  . Not on file  Social Needs  . Financial resource strain: Not on file  . Food insecurity    Worry: Not on file    Inability: Not  on file  . Transportation needs    Medical: Not on file    Non-medical: Not on file  Tobacco Use  . Smoking status: Never Smoker  . Smokeless tobacco: Never Used  Substance and Sexual Activity  . Alcohol use: No  . Drug use: Never  . Sexual activity: Yes    Birth control/protection: I.U.D.  Lifestyle  . Physical activity    Days per week: Not on file    Minutes per session: Not on file  . Stress: Not on file  Relationships  . Social Herbalist on phone: Not on file    Gets together: Not on file    Attends religious service: Not on file    Active member of club or organization: Not on file    Attends meetings of clubs or organizations: Not on file    Relationship status: Not on file  Other Topics Concern  . Not on file  Social History Narrative   ** Merged History Encounter **        Allergies: No Known Allergies  Metabolic Disorder Labs: No results found for: HGBA1C, MPG No results found for: PROLACTIN No results found for: CHOL, TRIG, HDL, CHOLHDL, VLDL, LDLCALC Lab Results  Component Value Date   TSH 2.170 10/17/2017   TSH 1.500 11/05/2016    Therapeutic Level Labs: No results found for: LITHIUM No results found for: VALPROATE No components found for:  CBMZ  Current Medications: Current Outpatient  Medications  Medication Sig Dispense Refill  . ABSORICA 30 MG capsule Take 30 mg by mouth daily.  0  . buPROPion (WELLBUTRIN SR) 200 MG 12 hr tablet Take 1 tablet (200 mg total) by mouth 2 (two) times daily. Take one tablet in AM, second one around 3 PM 180 tablet 1  . ISOtretinoin (ACCUTANE) 20 MG capsule Take 20 mg by mouth 2 (two) times daily.    Marland Kitchen levonorgestrel (MIRENA) 20 MCG/24HR IUD 1 each by Intrauterine route once.    . prazosin (MINIPRESS) 2 MG capsule Take 1 capsule (2 mg total) by mouth at bedtime. 90 capsule 1  . Vilazodone HCl (VIIBRYD) 40 MG TABS Take 1 tablet (40 mg total) by mouth daily. 90 tablet 1   No current facility-administered  medications for this visit.      Psychiatric Specialty Exam: Review of Systems  Psychiatric/Behavioral: The patient is nervous/anxious.   All other systems reviewed and are negative.   There were no vitals taken for this visit.There is no height or weight on file to calculate BMI.  General Appearance: NA  Eye Contact:  NA  Speech:  Clear and Coherent and Normal Rate  Volume:  Normal  Mood:  Mildly anxious.  Affect:  NA  Thought Process:  Goal Directed and Linear  Orientation:  Full (Time, Place, and Person)  Thought Content: Logical   Suicidal Thoughts:  No  Homicidal Thoughts:  No  Memory:  Immediate;   Good Recent;   Good Remote;   Good  Judgement:  Good  Insight:  Good  Psychomotor Activity:  NA  Concentration:  Concentration: Good  Recall:  Good  Fund of Knowledge: Good  Language: Good  Akathisia:  Negative  Handed:  Right  AIMS (if indicated): not done  Assets:  Communication Skills Desire for Improvement Financial Resources/Insurance Housing Physical Health Resilience Talents/Skills Vocational/Educational  ADL's:  Intact  Cognition: WNL  Sleep:  Good   Screenings: GAD-7     Office Visit from 06/28/2017 in Primary Care at [facility]  Total GAD-7 Score  15    PHQ2-9     Office Visit from 12/27/2017 in Primary Care at [facility] Visit from 10/17/2017 in Primary Care at [facility] Visit from 09/03/2017 in Primary Care at [facility] Visit from 07/19/2017 in Primary Care at [facility] Visit from 06/28/2017 in Primary Care at [facility] Total Score  4  3  3  5  5   PHQ-9 Total Score  13  13  18  18  18        Assessment and Plan: 33 yo female with hx of depression/anxiety and some residual sx of PTSD (nightmares) who has previously failed to respond to fluoxetine, then combination of citalopram and bupropion. We have then started vilazodone/bupropion combination and Jenny's mood improved significantly. She denies feeling depressed, anxious, tired, or  having difficulty with concentration. Episodic nightmares resolved on an increased dose of prazosin. 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 Problems with libido resolved once she came off citalopram. Cost of vilazodone at [facility] is [facility] while [facility].  Dx: MDD recurrent in partial remission  Plan: Continue Viibryd 40 mg, Wellbutrin SR 200 mg bid and prazosin 2 mg at HS. Next appointment in 3 months. The plan was discussed with patient who had an opportunity to ask questions and these were all answered. I spend 25 minutes in phone  consultation with the patient.   Magdalene Patricialgierd A Linell Shawn, MD 09/05/2018, 8:46 AM

## 2018-11-17 ENCOUNTER — Other Ambulatory Visit (HOSPITAL_COMMUNITY): Payer: Self-pay | Admitting: Psychiatry

## 2018-12-05 ENCOUNTER — Other Ambulatory Visit: Payer: Self-pay

## 2018-12-05 ENCOUNTER — Ambulatory Visit (HOSPITAL_COMMUNITY): Payer: Commercial Managed Care - PPO | Admitting: Psychiatry

## 2018-12-05 MED ORDER — VIIBRYD 40 MG PO TABS: 40.0000 mg | ORAL_TABLET | Freq: Every day | ORAL | 1 refills | Status: DC

## 2018-12-07 ENCOUNTER — Ambulatory Visit (INDEPENDENT_AMBULATORY_CARE_PROVIDER_SITE_OTHER): Payer: Commercial Managed Care - PPO | Admitting: Psychiatry

## 2018-12-07 ENCOUNTER — Other Ambulatory Visit: Payer: Self-pay

## 2018-12-07 DIAGNOSIS — F3341 Major depressive disorder, recurrent, in partial remission: Secondary | ICD-10-CM | POA: Diagnosis not present

## 2018-12-07 MED ORDER — VIIBRYD 40 MG PO TABS: 40.0000 mg | ORAL_TABLET | Freq: Every day | ORAL | 2 refills | Status: DC

## 2018-12-07 NOTE — Progress Notes (Signed)
BH MD/PA/NP OP Progress Note  12/07/2018 4:13 PM Jenny Alvarez  MRN:  [id number] Interview was conducted by phone and I verified that I was speaking with the correct person using two identifiers. I discussed the limitations of evaluation and management by telemedicine and  the availability of in person appointments. Patient expressed understanding and agreed to proceed.  Chief Complaint: "I am doing well".  HPI: 33yo female with hx of depression/anxietyand some residual sx of PTSD (nightmares) who has previously failed to respond to fluoxetine, then combination of citalopram and bupropion. We have then started vilazodone/bupropion combination and Jenny's mood improved significantly. She denies feeling depressed, anxious, tired, or having difficulty with concentration. Episodic nightmares resolved on an increased dose of prazosin. 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 Problems with libido resolved once she came off citalopram. [facility]. She is in remission of depressive symptoms and has no desire to change her medication regime at this time.  Visit Diagnosis:    ICD-10-CM   1. Major depressive disorder, recurrent episode, in partial remission (HCC)  F33.41     Past Psychiatric History: Please see intake H&P.  Past Medical History:  Past Medical History:  Diagnosis Date  . Anemia   . Asthma   . Depression    No past surgical history on file.  Family Psychiatric History: Reviewed.  Family History:  Family History  Problem Relation Age of Onset  . Alcohol abuse Mother   . Depression Mother     Social History:  Social History   Socioeconomic History  . Marital status: Single    Spouse name: Not on file  . Number of children: Not on file  . Years of education: Not on file  . Highest education level: Not on file  Occupational History  . Not on file  Social  Needs  . Financial resource strain: Not on file  . Food insecurity    Worry: Not on file    Inability: Not on file  . Transportation needs    Medical: Not on file    Non-medical: Not on file  Tobacco Use  . Smoking status: Never Smoker  . Smokeless tobacco: Never Used  Substance and Sexual Activity  . Alcohol use: No  . Drug use: Never  . Sexual activity: Yes    Birth control/protection: I.U.D.  Lifestyle  . Physical activity    Days per week: Not on file    Minutes per session: Not on file  . Stress: Not on file  Relationships  . Social Musicianconnections    Talks on phone: Not on file    Gets together: Not on file    Attends religious service: Not on file    Active member of club or organization: Not on file    Attends meetings of clubs or organizations: Not on file    Relationship status: Not on file  Other Topics Concern  . Not on file  Social History Narrative   ** Merged History Encounter **        Allergies: No Known Allergies  Metabolic Disorder Labs: No results found for: HGBA1C, MPG No results found for: PROLACTIN No results found for: CHOL, TRIG, HDL, CHOLHDL, VLDL, LDLCALC Lab Results  Component Value Date   TSH 2.170 10/17/2017   TSH 1.500 11/05/2016    Therapeutic Level Labs: No results found for:  LITHIUM No results found for: VALPROATE No components found for:  CBMZ  Current Medications: Current Outpatient Medications  Medication Sig Dispense Refill  . leuprolide (LUPRON DEPOT, 80-MONTH,) 3.75 MG injection Inject 3.75 mg into the muscle every 30 (thirty) days.    Marland Kitchen buPROPion (WELLBUTRIN SR) 200 MG 12 hr tablet Take 1 tablet (200 mg total) by mouth 2 (two) times daily. Take one tablet in AM, second one around 3 PM 180 tablet 1  . levonorgestrel (MIRENA) 20 MCG/24HR IUD 1 each by Intrauterine route once.    . prazosin (MINIPRESS) 2 MG capsule Take 1 capsule (2 mg total) by mouth at bedtime. 90 capsule 1  . Vilazodone HCl (VIIBRYD) 40 MG TABS Take 1  tablet (40 mg total) by mouth daily. 30 tablet 2   No current facility-administered medications for this visit.      Psychiatric Specialty Exam: Review of Systems  All other systems reviewed and are negative.   There were no vitals taken for this visit.There is no height or weight on file to calculate BMI.  General Appearance: NA  Eye Contact:  NA  Speech:  Clear and Coherent and Normal Rate  Volume:  Normal  Mood:  Euthymic  Affect:  NA  Thought Process:  Goal Directed and Linear  Orientation:  Full (Time, Place, and Person)  Thought Content: Logical   Suicidal Thoughts:  No  Homicidal Thoughts:  No  Memory:  Immediate;   Good Recent;   Good Remote;   Good  Judgement:  Good  Insight:  Good  Psychomotor Activity:  Normal  Concentration:  Concentration: Good and Attention Span: Good  Recall:  Good  Fund of Knowledge: Good  Language: Good  Akathisia:  Negative  Handed:  Right  AIMS (if indicated): not done  Assets:  Communication Skills Desire for Improvement Financial Resources/Insurance [place] Talents/Skills  ADL's:  Intact  Cognition: WNL  Sleep:  Good   Screenings: GAD-7     Office Visit from 06/28/2017 in Primary Care at [facility]  Total GAD-7 Score  15    PHQ2-9     Office Visit from 12/27/2017 in Primary Care at [place] from 10/17/2017 in Primary Care at [place] from 09/03/2017 in Primary Care at [place] from 07/19/2017 in Primary Care at [place] from 06/28/2017 in Primary Care at [facility] Total Score  4  3  3  5  5   PHQ-9 Total Score  13  13  18  18  18        Assessment and Plan: 33yo female with hx of depression/anxietyand some residual sx of PTSD (nightmares) who has previously failed to respond to fluoxetine, then combination of citalopram and bupropion. We have then started vilazodone/bupropion combination and Jenny's mood improved significantly. She denies feeling  depressed, anxious, tired, or having difficulty with concentration. Episodic nightmares resolved on an increased dose of prazosin. 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 Problems with libido resolved once she came off citalopram. [facility] of [facility]one at [facility] is [facility] while [facility]. She is in remission of depressive symptoms and has no desire to change her medication regime at this time.  Dx: MDD recurrent in remission; PTSD by hx  Plan: Continue Viibryd 40 mg, Wellbutrin SR 200 mg bid and prazosin 2 mg at HS. Next appointment in 3 months. The plan was  discussed with patient who had an opportunity to ask questions and these were all answered. I spend 25 minutes in phone consultation with the patient.     Magdalene Patricia, MD 12/07/2018, 4:13 PM

## 2018-12-11 ENCOUNTER — Ambulatory Visit: Payer: Commercial Managed Care - PPO | Admitting: Podiatry

## 2019-01-17 ENCOUNTER — Ambulatory Visit: Payer: Commercial Managed Care - PPO | Admitting: Podiatry

## 2019-01-18 ENCOUNTER — Other Ambulatory Visit: Payer: Self-pay

## 2019-01-18 ENCOUNTER — Encounter: Payer: Self-pay | Admitting: Podiatry

## 2019-01-18 ENCOUNTER — Ambulatory Visit: Payer: Commercial Managed Care - PPO | Admitting: Podiatry

## 2019-01-18 VITALS — BP 117/80 | HR 71 | Resp 16

## 2019-01-18 DIAGNOSIS — L603 Nail dystrophy: Secondary | ICD-10-CM | POA: Diagnosis not present

## 2019-01-18 NOTE — Progress Notes (Signed)
  Subjective:  Patient ID: Jenny Alvarez, female    DOB: 1985-08-27,  MRN: [id number] HPI Chief Complaint  Patient presents with  . 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 New Patient (Initial Visit)    Est pt 2016    34 y.o. female presents with the above complaint.   ROS: Denies fever chills nausea vomiting muscle aches pains calf pain back pain chest pain shortness of breath.  Past Medical History:  Diagnosis Date  . Anemia   . Asthma   . Depression    No past surgical history on file.  Current Outpatient Medications:  .  buPROPion (WELLBUTRIN SR) 200 MG 12 hr tablet, Take 1 tablet (200 mg total) by mouth 2 (two) times daily. Take one tablet in AM, second one around 3 PM, Disp: 180 tablet, Rfl: 1 .  leuprolide (LUPRON DEPOT, 27-MONTH,) 3.75 MG injection, Inject 3.75 mg into the muscle every 30 (thirty) days., Disp: , Rfl:  .  levonorgestrel (MIRENA) 20 MCG/24HR IUD, 1 each by Intrauterine route once., Disp: , Rfl:  .  prazosin (MINIPRESS) 2 MG capsule, Take 1 capsule (2 mg total) by mouth at bedtime., Disp: 90 capsule, Rfl: 1 .  Vilazodone HCl (VIIBRYD) 40 MG TABS, Take 1 tablet (40 mg total) by mouth daily., Disp: 30 tablet, Rfl: 2  No Known Allergies Review of Systems Objective:   Vitals:   01/18/19 1010  BP: 117/80  Pulse: 71  Resp: 16    General: Well developed, nourished, in no acute distress, alert and oriented x3   Dermatological: Skin is warm, dry and supple bilateral. Nails x 10 are well maintained; remaining integument appears unremarkable at this time. There are no open sores, no preulcerative lesions, no rash or signs of infection present.  It appears that there is some distal onycholysis with a line of delineation measuring approximately 6 mm from the eponychium.  There is no subungual debris.  Vascular: Dorsalis Pedis artery and Posterior Tibial artery pedal  pulses are 2/4 bilateral with immedate capillary fill time. Pedal hair growth present. No varicosities and no lower extremity edema present bilateral.   Neruologic: Grossly intact via light touch bilateral. Vibratory intact via tuning fork bilateral. Protective threshold with Semmes Wienstein monofilament intact to all pedal sites bilateral. Patellar and Achilles deep tendon reflexes 2+ bilateral. No Babinski or clonus noted bilateral.   Musculoskeletal: No gross boney pedal deformities bilateral. No pain, crepitus, or limitation noted with foot and ankle range of motion bilateral. Muscular strength 5/5 in all groups tested bilateral.  Gait: Unassisted, Nonantalgic.    Radiographs:  None taken  Assessment & Plan:   Assessment: Nail dystrophy cannot rule out onychomycosis  Plan: Samples of the hallux nail were taken today to be sent for pathologic evaluation.  Follow-up with her once the results are in.     Jenny Alvarez T. [place], [place]

## 2019-02-15 ENCOUNTER — Ambulatory Visit: Payer: Commercial Managed Care - PPO | Admitting: Podiatry

## 2019-03-02 ENCOUNTER — Telehealth: Payer: Self-pay | Admitting: *Deleted

## 2019-03-02 NOTE — Telephone Encounter (Signed)
Pt called and I informed of Dr. Geryl Rankins 03/02/2019 8:01am review of results.

## 2019-03-02 NOTE — Telephone Encounter (Signed)
-----   Message from Elinor Parkinson, [place] sent at 02/10/2019  9:37 AM EST ----- Negative for fungus

## 2019-03-02 NOTE — Telephone Encounter (Signed)
Left message for pt to call for results  

## 2019-03-05 ENCOUNTER — Ambulatory Visit (INDEPENDENT_AMBULATORY_CARE_PROVIDER_SITE_OTHER): Payer: Commercial Managed Care - PPO | Admitting: Psychiatry

## 2019-03-05 ENCOUNTER — Other Ambulatory Visit: Payer: Self-pay

## 2019-03-05 DIAGNOSIS — F3341 Major depressive disorder, recurrent, in partial remission: Secondary | ICD-10-CM

## 2019-03-05 MED ORDER — VIIBRYD 40 MG PO TABS: 40.0000 mg | ORAL_TABLET | Freq: Every day | ORAL | 2 refills | Status: DC

## 2019-03-05 MED ORDER — TEMAZEPAM 15 MG PO CAPS: 15.0000 mg | ORAL_CAPSULE | Freq: Every evening | ORAL | 2 refills | Status: DC | PRN

## 2019-03-05 MED ORDER — BUPROPION HCL ER (SR) 200 MG PO TB12: 200.0000 mg | ORAL_TABLET | Freq: Two times a day (BID) | ORAL | 1 refills | Status: DC
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
## 2019-03-20 ENCOUNTER — Ambulatory Visit (INDEPENDENT_AMBULATORY_CARE_PROVIDER_SITE_OTHER): Payer: Commercial Managed Care - PPO | Admitting: Podiatry

## 2019-03-20 ENCOUNTER — Encounter: Payer: Self-pay | Admitting: Podiatry

## 2019-03-20 ENCOUNTER — Telehealth: Payer: Self-pay | Admitting: *Deleted

## 2019-03-20 ENCOUNTER — Other Ambulatory Visit: Payer: Self-pay

## 2019-03-20 DIAGNOSIS — L603 Nail dystrophy: Secondary | ICD-10-CM

## 2019-03-20 MED ORDER — NUVAIL EX SOLN: 1.0000 "application " | Freq: Every day | CUTANEOUS | 11 refills | Status: AC

## 2019-03-20 NOTE — Progress Notes (Signed)
Patient presents to the office today. She got a call from the nurse letting her know results, but did not say that she didn't need to keep the appointment for today. I let her know we would send in the Nuvail to [facility] as requested and she could follow up as needed.

## 2019-03-20 NOTE — Telephone Encounter (Signed)
-----   Message from Kristian Covey, [facility] sent at 03/20/2019  8:14 AM EDT ----- Regarding: BAKO results Dr. Al Corpus reviewed this BAKO report and she is negative for fungus. He wanted you to call and let her know this and that she doesn't need an appointment. He said we could try nuvail if she wanted to try something to help appearance.

## 2019-03-20 NOTE — Telephone Encounter (Signed)
Pt informed of results 03/02/2019.

## 2019-05-17 ENCOUNTER — Other Ambulatory Visit (HOSPITAL_COMMUNITY): Payer: Self-pay | Admitting: *Deleted

## 2019-06-12 ENCOUNTER — Telehealth (INDEPENDENT_AMBULATORY_CARE_PROVIDER_SITE_OTHER): Payer: Commercial Managed Care - PPO | Admitting: Psychiatry

## 2019-06-12 ENCOUNTER — Other Ambulatory Visit: Payer: Self-pay

## 2019-06-12 DIAGNOSIS — F3342 Major depressive disorder, recurrent, in full remission: Secondary | ICD-10-CM

## 2019-06-12 MED ORDER — TEMAZEPAM 15 MG PO CAPS: 15.0000 mg | ORAL_CAPSULE | Freq: Every evening | ORAL | 2 refills | Status: DC | PRN

## 2019-06-12 MED ORDER — PRAZOSIN HCL 2 MG PO CAPS: 2.0000 mg | ORAL_CAPSULE | Freq: Every day | ORAL | 2 refills | Status: DC

## 2019-06-12 MED ORDER — VIIBRYD 40 MG PO TABS: 40.0000 mg | ORAL_TABLET | Freq: Every day | ORAL | 2 refills | Status: DC
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
## 2019-07-07 ENCOUNTER — Encounter: Payer: Self-pay | Admitting: Family Medicine

## 2019-08-29 ENCOUNTER — Telehealth (HOSPITAL_COMMUNITY): Payer: Commercial Managed Care - PPO | Admitting: Psychiatry

## 2020-02-03 ENCOUNTER — Other Ambulatory Visit (HOSPITAL_COMMUNITY): Payer: Self-pay | Admitting: Psychiatry

## 2020-02-13 ENCOUNTER — Other Ambulatory Visit: Payer: Self-pay

## 2020-02-13 ENCOUNTER — Telehealth (INDEPENDENT_AMBULATORY_CARE_PROVIDER_SITE_OTHER): Payer: Commercial Managed Care - PPO | Admitting: Psychiatry

## 2020-02-13 DIAGNOSIS — F330 Major depressive disorder, recurrent, mild: Secondary | ICD-10-CM

## 2020-02-13 MED ORDER — BUPROPION HCL ER (SR) 200 MG PO TB12: 200.0000 mg | ORAL_TABLET | Freq: Two times a day (BID) | ORAL | 1 refills | Status: DC

## 2020-02-13 MED ORDER — DULOXETINE HCL 30 MG PO CPEP: ORAL_CAPSULE | ORAL | 0 refills | Status: DC

## 2020-02-13 MED ORDER — TEMAZEPAM 7.5 MG PO CAPS: 7.5000 mg | ORAL_CAPSULE | Freq: Every evening | ORAL | 2 refills | Status: DC | PRN
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
## 2020-02-14 ENCOUNTER — Encounter (HOSPITAL_COMMUNITY): Payer: Self-pay | Admitting: *Deleted

## 2020-02-14 ENCOUNTER — Telehealth (HOSPITAL_COMMUNITY): Payer: Self-pay | Admitting: *Deleted

## 2020-02-14 NOTE — Telephone Encounter (Signed)
Prior authorization obtained for Cymbalta.  Case # [id number].  Med authorized for 01/05/20 thru 02/13/2023.

## 2020-03-20 ENCOUNTER — Other Ambulatory Visit: Payer: Self-pay

## 2020-03-20 ENCOUNTER — Telehealth (INDEPENDENT_AMBULATORY_CARE_PROVIDER_SITE_OTHER): Payer: Commercial Managed Care - PPO | Admitting: Psychiatry

## 2020-03-20 DIAGNOSIS — F330 Major depressive disorder, recurrent, mild: Secondary | ICD-10-CM

## 2020-03-20 MED ORDER — CLONAZEPAM 2 MG PO TABS: 2.0000 mg | ORAL_TABLET | Freq: Every evening | ORAL | 2 refills | Status: DC | PRN

## 2020-03-20 MED ORDER — DULOXETINE HCL 60 MG PO CPEP: 60.0000 mg | ORAL_CAPSULE | Freq: Every day | ORAL | 2 refills | Status: DC
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
## 2020-08-18 ENCOUNTER — Telehealth (HOSPITAL_COMMUNITY): Payer: Self-pay

## 2020-08-18 MED ORDER — CLONAZEPAM 2 MG PO TABS: 2.0000 mg | ORAL_TABLET | Freq: Every evening | ORAL | 0 refills | Status: AC | PRN

## 2020-08-18 MED ORDER — BUPROPION HCL ER (SR) 200 MG PO TB12: 200.0000 mg | ORAL_TABLET | Freq: Two times a day (BID) | ORAL | 0 refills | Status: AC

## 2020-08-18 MED ORDER — DULOXETINE HCL 60 MG PO CPEP: 60.0000 mg | ORAL_CAPSULE | Freq: Every day | ORAL | 0 refills | Status: AC

## 2020-08-18 NOTE — Telephone Encounter (Signed)
Done

## 2020-08-18 NOTE — Telephone Encounter (Addendum)
This patient is a former patient of Dr. Stanton Kidney. Patient just received her certified letter with list of referrals on Friday 8/5. However, she did not get a 30 day bridge supply. Could you please send in a bridge for her Wellbutrin SR 200mg , Conazepam 2mg , and her Duloxetine 60mg . She has an appointment scheduled with a new provider for the end of August, but will be completely out in a couple of days. Please review and advise. Thank you  NOTIFIED PATIENT - LVM

## 2020-10-28 ENCOUNTER — Encounter: Payer: Self-pay | Admitting: Nurse Practitioner

## 2020-10-28 ENCOUNTER — Other Ambulatory Visit: Payer: Self-pay

## 2020-10-28 ENCOUNTER — Ambulatory Visit: Payer: Commercial Managed Care - PPO | Admitting: Nurse Practitioner

## 2020-10-28 VITALS — BP 120/88 | HR 78 | Temp 98.2°F | Ht 64.5 in | Wt 212.6 lb

## 2020-10-28 DIAGNOSIS — J069 Acute upper respiratory infection, unspecified: Secondary | ICD-10-CM

## 2020-10-28 MED ORDER — BENZONATATE 100 MG PO CAPS: 100.0000 mg | ORAL_CAPSULE | Freq: Three times a day (TID) | ORAL | 0 refills | Status: AC | PRN

## 2020-10-28 NOTE — Progress Notes (Signed)
Subjective:  Patient ID: Jenny Alvarez, female    DOB: 11-25-1985  Age: 35 y.o. MRN: [id number]  CC: Establish Care (New patient/ Pt c/o fever, body aches, congestion, runny nose, and chills x 3 days. Pt has took otc medication (Sinus cold and flu, and Tylenol) with no relief. /Declines all vaccines today. )  URI  This is a new problem. The current episode started in the past 7 days. The problem has been unchanged. The maximum temperature recorded prior to her arrival was 100.4 - 100.9 F. The fever has been present for Less than 1 day. Associated symptoms include congestion, coughing, headaches, joint pain, rhinorrhea, sinus pain, sneezing and a sore throat. Pertinent negatives include no abdominal pain, chest pain, diarrhea, dysuria, ear pain, joint swelling, nausea, neck pain, plugged ear sensation, rash, swollen glands, vomiting or wheezing. She has tried acetaminophen, decongestant and NSAIDs for the symptoms. The treatment provided mild relief.  Received 2doses of Moderna COVID vaccine, last dose 02/2020 per patient. No influenza vaccine for this season.  Reviewed past Medical, Social and Family history today.  Outpatient Medications Prior to Visit  Medication Sig Dispense Refill   buPROPion (WELLBUTRIN SR) 200 MG 12 hr tablet Take 1 tablet (200 mg total) by mouth 2 (two) times daily. 90 tablet 0   clonazePAM (KLONOPIN) 2 MG tablet Take 1 tablet (2 mg total) by mouth at bedtime as needed (sleep). 30 tablet 0   Dermatological Products, Misc. (NUVAIL) SOLN Apply 1 application topically daily. 15 mL 11   DULoxetine (CYMBALTA) 60 MG capsule Take 1 capsule (60 mg total) by mouth daily. 30 capsule 0   leuprolide (LUPRON DEPOT, 8-MONTH,) 3.75 MG injection Inject 3.75 mg into the muscle every 30 (thirty) days.     levonorgestrel (MIRENA) 20 MCG/24HR IUD 1 each by Intrauterine route once.     prazosin (MINIPRESS) 2 MG capsule Take 2 mg by mouth daily.     traZODone (DESYREL) 50 MG tablet Take  50 mg by mouth at bedtime.     No facility-administered medications prior to visit.    ROS See HPI  Objective:  BP 120/88 (BP Location: Left Arm, Patient Position: Sitting, Cuff Size: Large)   Pulse 78   Temp 98.2 F (36.8 C) (Temporal)   Ht 5' 4.5" (1.638 m)   Wt 212 lb 9.6 oz (96.4 kg)   LMP  (LMP Unknown) Comment: No periods due to IUD  SpO2 98%   BMI 35.93 kg/m   Physical Exam Vitals reviewed.  Constitutional:      General: She is not in acute distress.    Appearance: She is well-developed.  HENT:     Head:     Jaw: There is normal jaw occlusion.     Salivary Glands: Right salivary gland is not diffusely enlarged or tender. Left salivary gland is not diffusely enlarged or tender.     Right Ear: External ear normal.     Left Ear: External ear normal.     Mouth/Throat:     Pharynx: Posterior oropharyngeal erythema present. No uvula swelling.     Tonsils: No tonsillar exudate.  Cardiovascular:     Rate and Rhythm: Normal rate and regular rhythm.     Pulses: Normal pulses.     Heart sounds: Normal heart sounds.  Pulmonary:     Effort: Pulmonary effort is normal. No respiratory distress.     Breath sounds: Normal breath sounds.  Chest:     Chest wall: No tenderness.  Musculoskeletal:        General: Normal range of motion.     Cervical back: Normal range of motion and neck supple.     Right lower leg: No edema.     Left lower leg: No edema.  Lymphadenopathy:     Cervical: No cervical adenopathy.  Neurological:     Mental Status: She is alert and oriented to person, place, and time.     Deep Tendon Reflexes: Reflexes are normal and symmetric.    Assessment & Plan:  This visit occurred during the SARS-CoV-2 public health emergency.  Safety protocols were in place, including screening questions prior to the visit, additional usage of staff PPE, and extensive cleaning of exam room while observing appropriate contact time as indicated for disinfecting solutions.    Jenny Alvarez was seen today for establish care.  Diagnoses and all orders for this visit:  Viral upper respiratory tract infection -     benzonatate (TESSALON) 100 MG capsule; Take 1-2 capsules (100-200 mg total) by mouth 3 (three) times daily as needed.  Problem List Items Addressed This Visit   None Visit Diagnoses     Viral upper respiratory tract infection    -  Primary   Relevant Medications   benzonatate (TESSALON) 100 MG capsule       Follow-up: Return in about 2 weeks (around 11/11/2020) for CPE (fasting).  Alysia Penna, NP

## 2020-10-28 NOTE — Patient Instructions (Signed)
Schedule appt for COVID PCR test. Negative influenza test.  URI Instructions: Flonase and Afrin use: apply 1spray of afrin in each nare, wait , then apply 2sprays of flonase in each nare. Use both nasal spray consecutively x 3days, then flonase only for at least 14days. Encourage adequate oral hydration. Use over-the-counter  "cold" medicines  such as "Tylenol cold" , "Advil cold",  "Mucinex" or" Mucinex D"  for cough and congestion.  Avoid decongestants if you have high blood pressure. Use" Delsym" or" Robitussin" cough syrup varietis for cough.  You can use plain "Tylenol" or "Advi"l for fever, chills and achyness.  "Common cold" symptoms are usually triggered by a virus.  The antibiotics are usually not necessary. On average, a" viral cold" illness would take 4-7 days to resolve. Please, make an appointment if you are not better or if you're worse.

## 2020-10-29 ENCOUNTER — Telehealth: Payer: Self-pay | Admitting: Nurse Practitioner

## 2020-11-04 NOTE — Telephone Encounter (Signed)
LVM for patient to return call  Letter can be accessed via MyChart.

## 2021-12-22 ENCOUNTER — Other Ambulatory Visit (HOSPITAL_COMMUNITY): Payer: Self-pay

## 2021-12-22 MED ORDER — WEGOVY 0.25 MG/0.5ML ~~LOC~~ SOAJ
0.2500 mg | SUBCUTANEOUS | 1 refills | Status: AC
  Filled 2021-12-22 – 2022-01-18 (×2): qty 2, 28d supply, fill #0

## 2021-12-29 ENCOUNTER — Other Ambulatory Visit (HOSPITAL_COMMUNITY): Payer: Self-pay

## 2022-01-06 ENCOUNTER — Other Ambulatory Visit (HOSPITAL_COMMUNITY): Payer: Self-pay

## 2022-01-18 ENCOUNTER — Other Ambulatory Visit (HOSPITAL_COMMUNITY): Payer: Self-pay

## 2022-01-20 ENCOUNTER — Other Ambulatory Visit: Payer: Self-pay

## 2022-02-17 ENCOUNTER — Other Ambulatory Visit (HOSPITAL_COMMUNITY): Payer: Self-pay

## 2022-02-17 MED ORDER — WEGOVY 0.5 MG/0.5ML ~~LOC~~ SOAJ
0.5000 mg | SUBCUTANEOUS | 0 refills | Status: DC
  Filled 2022-02-17: qty 2, 28d supply, fill #0

## 2022-02-24 ENCOUNTER — Other Ambulatory Visit (HOSPITAL_COMMUNITY): Payer: Self-pay

## 2022-03-04 ENCOUNTER — Other Ambulatory Visit (HOSPITAL_COMMUNITY): Payer: Self-pay

## 2022-03-30 ENCOUNTER — Other Ambulatory Visit: Payer: Self-pay

## 2022-03-30 ENCOUNTER — Other Ambulatory Visit (HOSPITAL_COMMUNITY): Payer: Self-pay

## 2022-03-30 MED ORDER — METFORMIN HCL ER 500 MG PO TB24
500.0000 mg | ORAL_TABLET | Freq: Every day | ORAL | 1 refills | Status: AC
  Filled 2022-03-30: qty 90, 90d supply, fill #0

## 2022-03-30 MED ORDER — GUANFACINE HCL ER 1 MG PO TB24
1.0000 mg | ORAL_TABLET | Freq: Every day | ORAL | 0 refills | Status: AC
  Filled 2022-03-30: qty 30, 30d supply, fill #0

## 2022-03-30 MED ORDER — TRAZODONE HCL 50 MG PO TABS
50.0000 mg | ORAL_TABLET | Freq: Every day | ORAL | 1 refills | Status: AC
  Filled 2022-03-30: qty 90, 90d supply, fill #0

## 2022-03-30 MED ORDER — WEGOVY 0.5 MG/0.5ML ~~LOC~~ SOAJ
0.5000 mg | SUBCUTANEOUS | 0 refills | Status: DC
  Filled 2022-03-30 (×2): qty 2, 28d supply, fill #0

## 2022-05-11 ENCOUNTER — Other Ambulatory Visit (HOSPITAL_COMMUNITY): Payer: Self-pay

## 2022-05-11 MED ORDER — WEGOVY 0.5 MG/0.5ML ~~LOC~~ SOAJ
SUBCUTANEOUS | 0 refills | Status: DC
  Filled 2022-05-11: qty 2, 28d supply, fill #0

## 2022-05-14 ENCOUNTER — Other Ambulatory Visit (HOSPITAL_COMMUNITY): Payer: Self-pay

## 2022-05-18 ENCOUNTER — Other Ambulatory Visit (HOSPITAL_COMMUNITY): Payer: Self-pay

## 2022-05-25 ENCOUNTER — Other Ambulatory Visit (HOSPITAL_COMMUNITY): Payer: Self-pay

## 2022-05-27 ENCOUNTER — Other Ambulatory Visit (HOSPITAL_COMMUNITY): Payer: Self-pay

## 2022-05-27 ENCOUNTER — Other Ambulatory Visit: Payer: Self-pay

## 2022-05-28 ENCOUNTER — Other Ambulatory Visit (HOSPITAL_COMMUNITY): Payer: Self-pay

## 2022-05-28 MED ORDER — OZEMPIC (1 MG/DOSE) 4 MG/3ML ~~LOC~~ SOPN
1.0000 mg | PEN_INJECTOR | SUBCUTANEOUS | 0 refills | Status: DC
  Filled 2022-05-28: qty 3, 28d supply, fill #0

## 2022-06-08 ENCOUNTER — Other Ambulatory Visit (HOSPITAL_COMMUNITY): Payer: Self-pay

## 2022-06-12 ENCOUNTER — Other Ambulatory Visit (HOSPITAL_COMMUNITY): Payer: Self-pay

## 2022-06-22 ENCOUNTER — Other Ambulatory Visit (HOSPITAL_COMMUNITY): Payer: Self-pay

## 2022-07-06 ENCOUNTER — Other Ambulatory Visit (HOSPITAL_COMMUNITY): Payer: Self-pay

## 2022-09-17 ENCOUNTER — Other Ambulatory Visit (HOSPITAL_COMMUNITY): Payer: Self-pay

## 2022-09-17 MED ORDER — LAMOTRIGINE 25 MG PO TABS
25.0000 mg | ORAL_TABLET | Freq: Every evening | ORAL | 1 refills | Status: DC
  Filled 2022-09-17: qty 30, 30d supply, fill #0

## 2022-09-22 ENCOUNTER — Other Ambulatory Visit (HOSPITAL_COMMUNITY): Payer: Self-pay

## 2022-11-04 ENCOUNTER — Telehealth: Payer: Self-pay | Admitting: Nurse Practitioner

## 2022-11-04 NOTE — Telephone Encounter (Signed)
Lvmtcb to schedule appt

## 2023-01-28 ENCOUNTER — Other Ambulatory Visit (HOSPITAL_COMMUNITY): Payer: Self-pay

## 2023-01-28 MED ORDER — PHENTERMINE HCL 37.5 MG PO TABS
18.7500 mg | ORAL_TABLET | Freq: Every day | ORAL | 0 refills | Status: DC
  Filled 2023-01-28: qty 30, 60d supply, fill #0

## 2023-01-28 MED ORDER — VITAMIN D (ERGOCALCIFEROL) 1.25 MG (50000 UNIT) PO CAPS
ORAL_CAPSULE | ORAL | 1 refills | Status: DC
  Filled 2023-01-28: qty 7, 70d supply, fill #0

## 2023-01-29 ENCOUNTER — Other Ambulatory Visit (HOSPITAL_COMMUNITY): Payer: Self-pay
